# Patient Record
Sex: Male | Born: 1995 | Race: White | Hispanic: No | Marital: Single | Smoking: Never smoker
Health system: Southern US, Community
[De-identification: ages and names within clinical notes are randomized; demographics above are authoritative.]

## PROBLEM LIST (undated history)

## (undated) DIAGNOSIS — T07XXXA Unspecified multiple injuries, initial encounter: Secondary | ICD-10-CM

## (undated) DIAGNOSIS — S62009A Unspecified fracture of navicular [scaphoid] bone of unspecified wrist, initial encounter for closed fracture: Secondary | ICD-10-CM

## (undated) HISTORY — PX: ORCHIOPEXY: SHX479

---

## 2017-03-28 ENCOUNTER — Ambulatory Visit (HOSPITAL_COMMUNITY)
Admission: EM | Admit: 2017-03-28 | Discharge: 2017-03-28 | Disposition: A | Discharge status: Home / Self Care | Payer: Federal, State, Local not specified - PPO | Attending: Internal Medicine | Admitting: Internal Medicine

## 2017-03-28 ENCOUNTER — Encounter (HOSPITAL_COMMUNITY): Payer: Self-pay | Admitting: Emergency Medicine

## 2017-03-28 ENCOUNTER — Ambulatory Visit (INDEPENDENT_AMBULATORY_CARE_PROVIDER_SITE_OTHER): Payer: Federal, State, Local not specified - PPO

## 2017-03-28 DIAGNOSIS — S62009A Unspecified fracture of navicular [scaphoid] bone of unspecified wrist, initial encounter for closed fracture: Secondary | ICD-10-CM

## 2017-03-28 DIAGNOSIS — S62001A Unspecified fracture of navicular [scaphoid] bone of right wrist, initial encounter for closed fracture: Secondary | ICD-10-CM | POA: Diagnosis not present

## 2017-03-28 DIAGNOSIS — T07XXXA Unspecified multiple injuries, initial encounter: Secondary | ICD-10-CM

## 2017-03-28 DIAGNOSIS — M25531 Pain in right wrist: Secondary | ICD-10-CM

## 2017-03-28 HISTORY — DX: Unspecified multiple injuries, initial encounter: T07.XXXA

## 2017-03-28 HISTORY — DX: Unspecified fracture of navicular (scaphoid) bone of unspecified wrist, initial encounter for closed fracture: S62.009A

## 2017-03-28 NOTE — ED Notes (Signed)
Paged ortho 

## 2017-03-28 NOTE — ED Provider Notes (Signed)
[facility]    CSN: [id number] Arrival date & time: 03/28/17  1222     History   Chief Complaint Chief Complaint  Patient presents with  . Fall  . Hand Pain    HPI Clinton Odonnell is a 21 y.o. male.   Larey SeatFell off his skateboard today and landed on his right hand. He complaints of right wrist pain. He does have full ROM. Patient requesting for xray. Pain is 3/10 currently. No weakness, numbness or tingling sensation.   HPI  History reviewed. No pertinent past medical history.  There are no active problems to display for this patient.   History reviewed. No pertinent surgical history.     Home Medications    Prior to Admission medications   Not on File    Family History History reviewed. No pertinent family history.  Social History Social History   Tobacco Use  . Smoking status: Never Smoker  . Smokeless tobacco: Never Used  Substance Use Topics  . Alcohol use: Yes    Frequency: Never  . Drug use: No     Allergies   Patient has no known allergies.   Review of Systems Review of Systems  Constitutional:       See history of present illness     Physical Exam Triage Vital Signs ED Triage Vitals  Enc Vitals Group     BP 03/28/17 1308 119/60     Pulse Rate 03/28/17 1308 68     Resp 03/28/17 1308 20     Temp 03/28/17 1308 98.3 F (36.8 C)     Temp Source 03/28/17 1308 Oral     SpO2 03/28/17 1308 99 %     Weight --      Height --      Head Circumference --      Peak Flow --      Pain Score 03/28/17 1309 4     Pain Loc --      Pain Edu? --      Excl. in GC? --    No data found.  Updated Vital Signs BP 119/60 (BP Location: Left Arm)   Pulse 68   Temp 98.3 F (36.8 C) (Oral)   Resp 20   SpO2 99%   Physical Exam  Constitutional: He appears well-developed and well-nourished.  Cardiovascular: Normal rate and regular rhythm.  Pulmonary/Chest: Effort normal and breath sounds normal.  Musculoskeletal:  Right wrist has full  range of motion. Motor and sensation intact. No swelling noted. Strength intact.  Skin:  Abrasions noted on right wrist.   Nursing note and vitals reviewed.    UC Treatments / Results  Labs (all labs ordered are listed, but only abnormal results are displayed) Labs Reviewed - No data to display  EKG  EKG Interpretation None       Radiology Dg Wrist Complete Right  Result Date: 03/28/2017 CLINICAL DATA:  Per pt: fell this morning skate boarding. Visible abrasion to the palm of hand, pain was pointed to the right wrist, distal forearm, no numbness or tingling in the digits. No prior injury to the right wrist. Patient is not a diabetic EXAM: RIGHT WRIST - COMPLETE 3+ VIEW COMPARISON:  None. FINDINGS: There is a minimally displaced fracture of the scaphoid waist, not associated with subluxation or dislocation. The distal radius and ulna are intact. Intercarpal spaces are normal. IMPRESSION: Minimally displaced scaphoid fracture. Electronically Signed   By: Norva PavlovElizabeth  Brown M.D.   On: 03/28/2017 13:42  Procedures Procedures (including critical care time)  Medications Ordered in UC Medications - No data to display   Initial Impression / Assessment and Plan / UC Course  I have reviewed the triage vital signs and the nursing notes.  Pertinent labs & imaging results that were available during my care of the patient were reviewed by me and considered in my medical decision making (see chart for details).  Final Clinical Impressions(s) / UC Diagnoses   Final diagnoses:  Closed displaced fracture of scaphoid of right wrist, unspecified portion of scaphoid, initial encounter   Xray shows minimally displaced scaphoid fracture. Patient informed of the x-ray result. Short arm splint applied by the [employer]. Patient to follow up with orthopedic next week for re-evaluation. Take ibuprofen for pain relief. Splint care discussed.   ED Discharge Orders    None     Controlled Substance  Prescriptions [place] Controlled Substance Registry consulted? Not Applicable   Lucia EstelleZheng, Shelina Luo, NP 03/28/17 1401

## 2017-03-28 NOTE — Progress Notes (Signed)
Orthopedic Tech Progress Note Patient Details:  Clinton HeirDennis Custer 07-04-1995 [id number]  Ortho Devices Type of Ortho Device: Ace wrap, Arm sling, Short arm splint Ortho Device/Splint Interventions: Application   Saul FordyceJennifer C Jamil Castillo 03/28/2017, 2:02 PM

## 2017-03-28 NOTE — ED Triage Notes (Signed)
Pt reports he fell of his long board/skateboard today around 1100  Was not wearing a helmet.... Denies head inj/LOC  C/o mult abrasion to right hand, right knee and left arm  Sts right hand/wrist is painful to move  A&O x4... NAD... Ambulatory

## 2017-03-28 NOTE — ED Notes (Signed)
Ortho called... Reports given.

## 2017-04-01 ENCOUNTER — Other Ambulatory Visit (INDEPENDENT_AMBULATORY_CARE_PROVIDER_SITE_OTHER): Payer: Self-pay | Admitting: Orthopaedic Surgery

## 2017-04-01 ENCOUNTER — Encounter (INDEPENDENT_AMBULATORY_CARE_PROVIDER_SITE_OTHER): Payer: Self-pay | Admitting: Orthopaedic Surgery

## 2017-04-01 ENCOUNTER — Other Ambulatory Visit: Payer: Self-pay

## 2017-04-01 ENCOUNTER — Ambulatory Visit (INDEPENDENT_AMBULATORY_CARE_PROVIDER_SITE_OTHER): Payer: Federal, State, Local not specified - PPO | Admitting: Orthopaedic Surgery

## 2017-04-01 ENCOUNTER — Encounter (HOSPITAL_BASED_OUTPATIENT_CLINIC_OR_DEPARTMENT_OTHER): Payer: Self-pay | Admitting: *Deleted

## 2017-04-01 DIAGNOSIS — S62001A Unspecified fracture of navicular [scaphoid] bone of right wrist, initial encounter for closed fracture: Secondary | ICD-10-CM

## 2017-04-01 DIAGNOSIS — S62034A Nondisplaced fracture of proximal third of navicular [scaphoid] bone of right wrist, initial encounter for closed fracture: Secondary | ICD-10-CM

## 2017-04-01 DIAGNOSIS — S62001D Unspecified fracture of navicular [scaphoid] bone of right wrist, subsequent encounter for fracture with routine healing: Secondary | ICD-10-CM | POA: Insufficient documentation

## 2017-04-01 NOTE — Progress Notes (Signed)
   Office Visit Note   Patient: Clinton Odonnell           Date of Birth: May 25, 1995           MRN: [id number] Visit Date: 04/01/2017              Requested by: No referring provider defined for this encounter. PCP: Patient, No Pcp Per   Assessment & Plan: Visit Diagnoses:  1. Nondisplaced fracture of proximal third of navicular (scaphoid) bone of right wrist, initial encounter for closed fracture     Plan: Impression is right scaphoid mid waist and proximal fracture.  Recommendation is for compression screw fixation given risk of nonunion.  We discussed risk benefits alternatives of surgery including infection, stiffness, weakness.  He understands and wished to proceed.  We will get him scheduled for tomorrow.  Follow-Up Instructions: Return for 1 week postop visit.   Orders:  No orders of the defined types were placed in this encounter.  No orders of the defined types were placed in this encounter.     Procedures: No procedures performed   Clinical Data: No additional findings.   Subjective: Chief Complaint  Patient presents with  . 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  He sustained a mid waist proximal third right scaphoid fracture.  He denies any numbness and tingling or neurovascular compromise    Review of Systems  Constitutional: Negative.   All other systems reviewed and are negative.    Objective: Vital Signs: There were no vitals taken for this visit.  Physical Exam  Constitutional: He is oriented to person, place, and time. He appears well-developed and well-nourished.  HENT:  Head: Normocephalic and atraumatic.  Eyes: Pupils are equal, round, and reactive to light.  Neck: Neck supple.  Pulmonary/Chest: Effort normal.  Abdominal: Soft.  Musculoskeletal: Normal range of motion.  Neurological: He is alert  and oriented to person, place, and time.  Skin: Skin is warm.  Psychiatric: He has a normal mood and affect. His behavior is normal. Judgment and thought content normal.  Nursing note and vitals reviewed.   Ortho Exam Right wrist exam shows tenderness of the anatomic snuffbox and scaphoid tubercle.  No no neurovascular compromise.  Hand is warm well perfused. Specialty Comments:  No specialty comments available.  Imaging: No results found.   PMFS History: Patient Active Problem List   Diagnosis Date Noted  . Nondisplaced fracture of proximal third of navicular (scaphoid) bone of right wrist, initial encounter for closed fracture 04/01/2017   No past medical history on file.  No family history on file.  No past surgical history on file. Social History   Occupational History  . Not on file  Tobacco Use  . Smoking status: Never Smoker  . Smokeless tobacco: Never Used  Substance and Sexual Activity  . Alcohol use: Yes    Frequency: Never  . Drug use: No  . Sexual activity: Yes    Birth control/protection: None

## 2017-04-02 ENCOUNTER — Ambulatory Visit (HOSPITAL_BASED_OUTPATIENT_CLINIC_OR_DEPARTMENT_OTHER)
Admission: RE | Admit: 2017-04-02 | Discharge: 2017-04-02 | Disposition: A | Discharge status: Home / Self Care | Payer: Federal, State, Local not specified - PPO | Source: Ambulatory Visit | Attending: Orthopaedic Surgery | Admitting: Orthopaedic Surgery

## 2017-04-02 ENCOUNTER — Ambulatory Visit (HOSPITAL_BASED_OUTPATIENT_CLINIC_OR_DEPARTMENT_OTHER): Payer: Federal, State, Local not specified - PPO | Admitting: Anesthesiology

## 2017-04-02 ENCOUNTER — Encounter (HOSPITAL_BASED_OUTPATIENT_CLINIC_OR_DEPARTMENT_OTHER): Payer: Self-pay | Admitting: Certified Registered Nurse Anesthetist

## 2017-04-02 ENCOUNTER — Encounter (HOSPITAL_BASED_OUTPATIENT_CLINIC_OR_DEPARTMENT_OTHER)
Admission: RE | Disposition: A | Discharge status: Home / Self Care | Payer: Self-pay | Source: Ambulatory Visit | Attending: Orthopaedic Surgery

## 2017-04-02 ENCOUNTER — Ambulatory Visit (HOSPITAL_COMMUNITY): Payer: Federal, State, Local not specified - PPO

## 2017-04-02 DIAGNOSIS — Y9351 Activity, roller skating (inline) and skateboarding: Secondary | ICD-10-CM | POA: Insufficient documentation

## 2017-04-02 DIAGNOSIS — W19XXXA Unspecified fall, initial encounter: Secondary | ICD-10-CM | POA: Diagnosis not present

## 2017-04-02 DIAGNOSIS — S62001A Unspecified fracture of navicular [scaphoid] bone of right wrist, initial encounter for closed fracture: Secondary | ICD-10-CM | POA: Insufficient documentation

## 2017-04-02 DIAGNOSIS — S62034A Nondisplaced fracture of proximal third of navicular [scaphoid] bone of right wrist, initial encounter for closed fracture: Secondary | ICD-10-CM | POA: Diagnosis not present

## 2017-04-02 DIAGNOSIS — Z4789 Encounter for other orthopedic aftercare: Secondary | ICD-10-CM

## 2017-04-02 HISTORY — DX: Unspecified multiple injuries, initial encounter: T07.XXXA

## 2017-04-02 HISTORY — DX: Unspecified fracture of navicular (scaphoid) bone of unspecified wrist, initial encounter for closed fracture: S62.009A

## 2017-04-02 HISTORY — PX: ORIF SCAPHOID FRACTURE: SHX2130

## 2017-04-02 SURGERY — OPEN REDUCTION INTERNAL FIXATION (ORIF) SCAPHOID FRACTURE
Anesthesia: General | Site: Arm Lower | Laterality: Right

## 2017-04-02 MED ORDER — ONDANSETRON HCL 4 MG/2ML IJ SOLN
INTRAMUSCULAR | Status: AC
  Filled 2017-04-02: qty 2

## 2017-04-02 MED ORDER — FENTANYL CITRATE (PF) 100 MCG/2ML IJ SOLN
INTRAMUSCULAR | Status: DC | PRN
  Administered 2017-04-02 (×4): 25 ug via INTRAVENOUS

## 2017-04-02 MED ORDER — OXYCODONE-ACETAMINOPHEN 5-325 MG PO TABS: 1.0000 | ORAL_TABLET | ORAL | 0 refills | Status: AC | PRN

## 2017-04-02 MED ORDER — BUPIVACAINE-EPINEPHRINE (PF) 0.5% -1:200000 IJ SOLN
INTRAMUSCULAR | Status: DC | PRN
  Administered 2017-04-02: 25 mL via PERINEURAL

## 2017-04-02 MED ORDER — PROPOFOL 10 MG/ML IV BOLUS
INTRAVENOUS | Status: DC | PRN
  Administered 2017-04-02: 200 mg via INTRAVENOUS

## 2017-04-02 MED ORDER — PROMETHAZINE HCL 25 MG PO TABS: 25.0000 mg | ORAL_TABLET | Freq: Four times a day (QID) | ORAL | 1 refills | Status: AC | PRN

## 2017-04-02 MED ORDER — DEXAMETHASONE SODIUM PHOSPHATE 10 MG/ML IJ SOLN
INTRAMUSCULAR | Status: AC
  Filled 2017-04-02: qty 1

## 2017-04-02 MED ORDER — FENTANYL CITRATE (PF) 100 MCG/2ML IJ SOLN
INTRAMUSCULAR | Status: AC
  Filled 2017-04-02: qty 2

## 2017-04-02 MED ORDER — DEXAMETHASONE SODIUM PHOSPHATE 10 MG/ML IJ SOLN
INTRAMUSCULAR | Status: DC | PRN
  Administered 2017-04-02: 10 mg via INTRAVENOUS

## 2017-04-02 MED ORDER — LACTATED RINGERS IV SOLN
INTRAVENOUS | Status: DC
  Administered 2017-04-02 (×2): via INTRAVENOUS

## 2017-04-02 MED ORDER — LIDOCAINE 2% (20 MG/ML) 5 ML SYRINGE
INTRAMUSCULAR | Status: AC
  Filled 2017-04-02: qty 5

## 2017-04-02 MED ORDER — FENTANYL CITRATE (PF) 100 MCG/2ML IJ SOLN
50.0000 ug | INTRAMUSCULAR | Status: DC | PRN
  Administered 2017-04-02 (×2): 50 ug via INTRAVENOUS

## 2017-04-02 MED ORDER — LIDOCAINE 2% (20 MG/ML) 5 ML SYRINGE
INTRAMUSCULAR | Status: DC | PRN
  Administered 2017-04-02: 100 mg via INTRAVENOUS

## 2017-04-02 MED ORDER — CEFAZOLIN SODIUM-DEXTROSE 2-4 GM/100ML-% IV SOLN
INTRAVENOUS | Status: AC
  Filled 2017-04-02: qty 100

## 2017-04-02 MED ORDER — HYDROMORPHONE HCL 1 MG/ML IJ SOLN: 0.2500 mg | INTRAMUSCULAR | Status: DC | PRN

## 2017-04-02 MED ORDER — CEFAZOLIN SODIUM-DEXTROSE 2-4 GM/100ML-% IV SOLN
2.0000 g | INTRAVENOUS | Status: AC
Start: 2017-04-03 — End: 2017-04-02
  Administered 2017-04-02: 2 g via INTRAVENOUS

## 2017-04-02 MED ORDER — PROMETHAZINE HCL 25 MG/ML IJ SOLN: 6.2500 mg | INTRAMUSCULAR | Status: DC | PRN

## 2017-04-02 MED ORDER — MIDAZOLAM HCL 2 MG/2ML IJ SOLN
1.0000 mg | INTRAMUSCULAR | Status: DC | PRN
  Administered 2017-04-02 (×2): 1 mg via INTRAVENOUS

## 2017-04-02 MED ORDER — SCOPOLAMINE 1 MG/3DAYS TD PT72: 1.0000 | MEDICATED_PATCH | Freq: Once | TRANSDERMAL | Status: DC | PRN

## 2017-04-02 MED ORDER — ONDANSETRON HCL 4 MG/2ML IJ SOLN
INTRAMUSCULAR | Status: DC | PRN
  Administered 2017-04-02: 4 mg via INTRAVENOUS

## 2017-04-02 MED ORDER — MIDAZOLAM HCL 2 MG/2ML IJ SOLN
INTRAMUSCULAR | Status: AC
  Filled 2017-04-02: qty 2

## 2017-04-02 SURGICAL SUPPLY — 69 items
BANDAGE ACE 3X5.8 VEL STRL LF (GAUZE/BANDAGES/DRESSINGS) ×3 IMPLANT
BANDAGE ACE 4X5 VEL STRL LF (GAUZE/BANDAGES/DRESSINGS) IMPLANT
BIT DRILL CANN 2.4 (BIT) ×2
BIT DRILL CANN 2.4MM (BIT) ×1
BIT DRILL CANN MAX VPC 2.4 (BIT) ×1 IMPLANT
BLADE MINI RND TIP GREEN BEAV (BLADE) IMPLANT
BLADE SURG 15 STRL LF DISP TIS (BLADE) ×2 IMPLANT
BLADE SURG 15 STRL SS (BLADE) ×4
BNDG ESMARK 4X9 LF (GAUZE/BANDAGES/DRESSINGS) ×3 IMPLANT
BRUSH SCRUB EZ PLAIN DRY (MISCELLANEOUS) ×3 IMPLANT
CORD BIPOLAR FORCEPS 12FT (ELECTRODE) ×3 IMPLANT
COVER BACK TABLE 60X90IN (DRAPES) ×3 IMPLANT
COVER MAYO STAND STRL (DRAPES) ×3 IMPLANT
CUFF TOURNIQUET SINGLE 18IN (TOURNIQUET CUFF) ×3 IMPLANT
DECANTER SPIKE VIAL GLASS SM (MISCELLANEOUS) IMPLANT
DRAPE C-ARM 42X72 X-RAY (DRAPES) ×3 IMPLANT
DRAPE EXTREMITY T 121X128X90 (DRAPE) ×3 IMPLANT
DRAPE SURG 17X23 STRL (DRAPES) ×3 IMPLANT
GAUZE SPONGE 4X4 12PLY STRL (GAUZE/BANDAGES/DRESSINGS) ×3 IMPLANT
GAUZE SPONGE 4X4 16PLY XRAY LF (GAUZE/BANDAGES/DRESSINGS) IMPLANT
GAUZE XEROFORM 1X8 LF (GAUZE/BANDAGES/DRESSINGS) ×3 IMPLANT
GLOVE BIO SURGEON STRL SZ 6.5 (GLOVE) ×2 IMPLANT
GLOVE BIO SURGEONS STRL SZ 6.5 (GLOVE) ×1
GLOVE BIOGEL PI IND STRL 6.5 (GLOVE) ×1 IMPLANT
GLOVE BIOGEL PI IND STRL 7.0 (GLOVE) ×2 IMPLANT
GLOVE BIOGEL PI INDICATOR 6.5 (GLOVE) ×2
GLOVE BIOGEL PI INDICATOR 7.0 (GLOVE) ×4
GLOVE SKINSENSE NS SZ7.5 (GLOVE) ×2
GLOVE SKINSENSE STRL SZ7.5 (GLOVE) ×1 IMPLANT
GLOVE SURG SYN 7.5  E (GLOVE) ×2
GLOVE SURG SYN 7.5 E (GLOVE) ×1 IMPLANT
GOWN STRL REIN XL XLG (GOWN DISPOSABLE) ×3 IMPLANT
GOWN STRL REUS W/ TWL LRG LVL3 (GOWN DISPOSABLE) ×1 IMPLANT
GOWN STRL REUS W/TWL LRG LVL3 (GOWN DISPOSABLE) ×2
K-WIRE COCR 1.1X105 (WIRE) ×6
KWIRE COCR 1.1X105 (WIRE) ×2 IMPLANT
NEEDLE HYPO 22GX1.5 SAFETY (NEEDLE) IMPLANT
NS IRRIG 1000ML POUR BTL (IV SOLUTION) ×3 IMPLANT
PACK BASIN DAY SURGERY FS (CUSTOM PROCEDURE TRAY) ×3 IMPLANT
PAD CAST 3X4 CTTN HI CHSV (CAST SUPPLIES) ×1 IMPLANT
PADDING CAST ABS 3INX4YD NS (CAST SUPPLIES)
PADDING CAST ABS COTTON 3X4 (CAST SUPPLIES) IMPLANT
PADDING CAST COTTON 3X4 STRL (CAST SUPPLIES) ×2
PADDING CAST SYNTHETIC 3 NS LF (CAST SUPPLIES)
PADDING CAST SYNTHETIC 3X4 NS (CAST SUPPLIES) IMPLANT
PADDING CAST SYNTHETIC 4 (CAST SUPPLIES)
PADDING CAST SYNTHETIC 4X4 STR (CAST SUPPLIES) IMPLANT
SCREW CANN MAX VPC 3.4X18 (Screw) ×1 IMPLANT
SCREW CANN MAX VPC 3.4X22 (Screw) ×1 IMPLANT
SCREW MAX VPS 3.4X22 (Screw) ×2 IMPLANT
SCREW VPS 3.4X18MM (Screw) ×2 IMPLANT
SLEEVE SCD COMPRESS KNEE MED (MISCELLANEOUS) ×3 IMPLANT
SPLINT FIBERGLASS 3X35 (CAST SUPPLIES) IMPLANT
SPONGE LAP 18X18 X RAY DECT (DISPOSABLE) ×3 IMPLANT
STOCKINETTE 4X48 STRL (DRAPES) ×3 IMPLANT
SUCTION FRAZIER HANDLE 10FR (MISCELLANEOUS) ×2
SUCTION TUBE FRAZIER 10FR DISP (MISCELLANEOUS) ×1 IMPLANT
SUT ETHILON 3 0 PS 1 (SUTURE) ×3 IMPLANT
SUT VIC AB 2-0 SH 27 (SUTURE) ×2
SUT VIC AB 2-0 SH 27XBRD (SUTURE) ×1 IMPLANT
SYR BULB 3OZ (MISCELLANEOUS) ×3 IMPLANT
SYR CONTROL 10ML LL (SYRINGE) IMPLANT
TOWEL OR 17X24 6PK STRL BLUE (TOWEL DISPOSABLE) ×6 IMPLANT
TOWEL OR NON WOVEN STRL DISP B (DISPOSABLE) ×3 IMPLANT
TRAY DSU PREP LF (CUSTOM PROCEDURE TRAY) ×3 IMPLANT
TUBE CONNECTING 20'X1/4 (TUBING) ×1
TUBE CONNECTING 20X1/4 (TUBING) ×2 IMPLANT
UNDERPAD 30X30 (UNDERPADS AND DIAPERS) ×3 IMPLANT
YANKAUER SUCT BULB TIP NO VENT (SUCTIONS) IMPLANT

## 2017-04-02 NOTE — Anesthesia Procedure Notes (Signed)
Procedure Name: LMA Insertion Date/Time: 04/02/2017 2:35 PM Performed by: Pearson Grippeobertson, Camika Marsico M, CRNA Pre-anesthesia Checklist: Patient identified, Emergency Drugs available, Suction available and Patient being monitored Patient Re-evaluated:Patient Re-evaluated prior to induction Oxygen Delivery Method: Circle system utilized Preoxygenation: Pre-oxygenation with 100% oxygen Induction Type: IV induction Ventilation: Mask ventilation without difficulty LMA: LMA inserted LMA Size: 4.0 Number of attempts: 1 Airway Equipment and Method: Bite block Placement Confirmation: positive ETCO2 Tube secured with: Tape Dental Injury: Teeth and Oropharynx as per pre-operative assessment

## 2017-04-02 NOTE — Anesthesia Procedure Notes (Addendum)
Anesthesia Regional Block: Supraclavicular block   Pre-Anesthetic Checklist: ,, timeout performed, Correct Patient, Correct Site, Correct Laterality, Correct Procedure, Correct Position, site marked, Risks and benefits discussed,  Surgical consent,  Pre-op evaluation,  At surgeon's request and post-op pain management  Laterality: Right and Upper  Prep: chloraprep       Needles:   Needle Type: Echogenic Stimulator Needle     Needle Length: 9cm  Needle Gauge: 21   Needle insertion depth: 5 cm   Additional Needles:   Procedures:,,,, ultrasound used (permanent image in chart),,,,  Narrative:  Start time: 04/02/2017 2:10 PM End time: 04/02/2017 2:26 PM Injection made incrementally with aspirations every 5 mL.  Performed by: Personally  Anesthesiologist: Sharee HolsterMassagee, Kellen Dutch, MD

## 2017-04-02 NOTE — H&P (Signed)
    PREOPERATIVE H&P  Chief Complaint: right scaphoid fracture  HPI: Clinton HeirDennis Odonnell is a 21 y.o. male who presents for surgical treatment of right scaphoid fracture.  He denies any changes in medical history.  Past Medical History:  Diagnosis Date  . Abrasions of multiple sites 03/28/2017   bilateral forearm, right hand  . Scaphoid fracture of wrist 03/28/2017   right   Past Surgical History:  Procedure Laterality Date  . ORCHIOPEXY     age 21   Social History   Socioeconomic History  . Marital status: Single    Spouse name: None  . Number of children: None  . Years of education: None  . Highest education level: None  Social Needs  . Financial resource strain: None  . Food insecurity - worry: None  . Food insecurity - inability: None  . Transportation needs - medical: None  . Transportation needs - non-medical: None  Occupational History  . None  Tobacco Use  . Smoking status: Never Smoker  . Smokeless tobacco: Never Used  Substance and Sexual Activity  . Alcohol use: Yes    Frequency: Never    Comment: occasionally  . Drug use: No  . Sexual activity: Yes    Birth control/protection: None  Other Topics Concern  . None  Social History Narrative  . None   History reviewed. No pertinent family history. No Known Allergies Prior to Admission medications   Not on File     Positive ROS: All other systems have been reviewed and were otherwise negative with the exception of those mentioned in the HPI and as above.  Physical Exam: General: Alert, no acute distress Cardiovascular: No pedal edema Respiratory: No cyanosis, no use of accessory musculature GI: abdomen soft Skin: No lesions in the area of chief complaint Neurologic: Sensation intact distally Psychiatric: Patient is competent for consent with normal mood and affect Lymphatic: no lymphedema  MUSCULOSKELETAL: exam stable  Assessment: right scaphoid fracture  Plan: Plan for  Procedure(s): OPEN REDUCTION INTERNAL FIXATION (ORIF) RIGHT SCAPHOID FRACTURE  The risks benefits and alternatives were discussed with the patient including but not limited to the risks of nonoperative treatment, versus surgical intervention including infection, bleeding, nerve injury,  blood clots, cardiopulmonary complications, morbidity, mortality, among others, and they were willing to proceed.   Glee ArvinMichael Rachel Rison, MD   04/02/2017 2:09 PM

## 2017-04-02 NOTE — Anesthesia Preprocedure Evaluation (Signed)
Anesthesia Evaluation  Patient identified by MRN, date of birth, ID band Patient awake    Reviewed: Allergy & Precautions, NPO status , Patient's Chart, lab work & pertinent test results  Airway Mallampati: I  TM Distance: >3 FB Neck ROM: Full    Dental no notable dental hx. (+) Teeth Intact   Pulmonary neg pulmonary ROS,    breath sounds clear to auscultation       Cardiovascular negative cardio ROS   Rhythm:Regular Rate:Normal     Neuro/Psych negative neurological ROS  negative psych ROS   GI/Hepatic negative GI ROS, Neg liver ROS,   Endo/Other  negative endocrine ROS  Renal/GU negative Renal ROS  negative genitourinary   Musculoskeletal negative musculoskeletal ROS (+)   Abdominal   Peds negative pediatric ROS (+)  Hematology negative hematology ROS (+)   Anesthesia Other Findings   Reproductive/Obstetrics negative OB ROS                             Anesthesia Physical Anesthesia Plan  ASA: I  Anesthesia Plan: General   Post-op Pain Management:  Regional for Post-op pain   Induction: Intravenous  PONV Risk Score and Plan: 3 and Ondansetron, Dexamethasone and Treatment may vary due to age or medical condition  Airway Management Planned: LMA  Additional Equipment:   Intra-op Plan:   Post-operative Plan: Extubation in OR  Informed Consent: I have reviewed the patients History and Physical, chart, labs and discussed the procedure including the risks, benefits and alternatives for the proposed anesthesia with the patient or authorized representative who has indicated his/her understanding and acceptance.   Dental advisory given  Plan Discussed with: CRNA  Anesthesia Plan Comments:         Anesthesia Quick Evaluation

## 2017-04-02 NOTE — Op Note (Addendum)
   DATE OF SURGERY:04/02/2017  PREOPERATIVE DIAGNOSIS:    POSTOPERATIVE DIAGNOSIS: same  PROCEDURE: 1. Right open treatment internal fixation of scaphoid fracture. CPT 803 [id number]  IMPLANT: Zimmer Max VPC 18 mm x 3.4 mm screw  SURGEON: N. Glee ArvinMichael Xu, MD  ANESTHESIA:  General  TOURNIQUET TIME: 41 mins.  BLOOD LOSS: Minimal.  COMPLICATIONS: None.  PATHOLOGY: None.  TIME OUT: A time out was performed before the procedure started.  INDICATIONS: The patient is a 21 y.o. -year-old male who presented with a right scaphoid fracture indicated for osteosynthesis.  DESCRIPTION OF PROCEDURE: The operative extremity was prepped and draped in standard sterile fashion.  Exsanguination was achieved.  Tourniquet was inflated to 250 mmHg.  A 2 cm incision distal to Lister's tubercle was created.  Blunt dissection was carried down to the extensor retinaculum and the EPL tendon.  No branches of sensory nerves were encountered.  The EPL tendon was retracted ulnarly.  The wrist was then flexed and pronated in order to deliver the proximal pole of the scaphoid.  A guidepin was then placed at the proximal pole of the scaphoid using fluoroscopic guidance.  The guide pin was then advanced in a distal radial direction using fluoroscopic guidance.  Multiple fluoroscopic views were taken to confirm appropriate trajectory of the guidepin.  The length of the screw was then measured.  The guidepin was then advanced past the far cortex.  The near cortex was then drilled using the cannulated drill bit.  The 18 mm x 3.4 mm cannulated compression screw was then inserted over the K wire under fluoroscopic guidance to the appropriate depth.  Multiple fluoroscopic views were taken confirming the screw inside the scaphoid without violating the articular surface.  At this point, all wires were removed.  Final fluoroscopy imaging was taken and wound was irrigated and closed using 3-0 nylon sutures.  Sterile dressing applied.   Tourniquet was deflated.  A thumb spica  brace was applied.  All counts were correct.  The patient was transferred to the recovery room under stable condition.  Mayra ReelN. Michael Xu, MD [facility] [phone] 3:42 PM

## 2017-04-02 NOTE — Progress Notes (Signed)
Assisted Dr. Massagee with right, ultrasound guided, supraclavicular block. Side rails up, monitors on throughout procedure. See vital signs in flow sheet. Tolerated Procedure well. 

## 2017-04-02 NOTE — Anesthesia Postprocedure Evaluation (Signed)
Anesthesia Post Note  Patient: Clinton Odonnell  Procedure(s) Performed: OPEN REDUCTION INTERNAL FIXATION (ORIF) RIGHT SCAPHOID FRACTURE (Right Arm Lower)     Patient location during evaluation: PACU Anesthesia Type: General Level of consciousness: awake and alert Pain management: pain level controlled Vital Signs Assessment: post-procedure vital signs reviewed and stable Respiratory status: spontaneous breathing, nonlabored ventilation, respiratory function stable and patient connected to nasal cannula oxygen Cardiovascular status: blood pressure returned to baseline and stable Postop Assessment: no apparent nausea or vomiting Anesthetic complications: no    Last Vitals:  Vitals:   04/02/17 1615 04/02/17 1630  BP: 120/79 116/72  Pulse: 78 70  Resp: 14 10  Temp:    SpO2: 97% 97%    Last Pain:  Vitals:   04/02/17 1615  TempSrc:   PainSc: Ardean LarsenAsleep                 [name] E

## 2017-04-02 NOTE — Discharge Instructions (Signed)
Bike Safety, Adult Riding a bike is a fun activity that is good for your health. However, it is important that you know how to stay safe while biking. What do I need to wear while biking? Helmet A helmet is the most important piece of equipment that you can wear to protect yourself while riding a bike. Make sure that you:  Always wear a helmet when you ride a bike, and make sure that the straps are fastened.  Wear a helmet that is specifically made for biking.  Have a helmet that has been safety-approved. Look for a helmet that has a Midwife) sticker. If you have any questions, ask them at the store where you are buying the helmet. Never buy a used helmet.  Get a new helmet if you get into a bike accident. You should also get a new helmet every five years or sooner.  Have a helmet that is well-ventilated.  A helmet will not help to protect you if it does not fit properly. Here are some tips to make sure your helmet fits:  The helmet should sit on top of your head. It should not tip backward or forward.  Find the smallest helmet shell size that fits over your head.  Do not use helmet pads to make a helmet fit if it is too big for your head.  Leave space for about two fingers between your eyebrows and the front brim of the helmet.  The straps should be joined under each of your ears at the jawbone.  The buckle should be snug when your mouth is completely open.  Other equipment Make sure that you wear:  Shoes that are safe for biking, such as sneakers. The shoes should not slip on the pedals. Do not ride a bike barefoot. ? Do not wear flip flops. ? Do not wear cleats. ? Do not wear shoes with heels.  Pants that are fitted, if you are wearing pants. If your pants are too loose or wide at the bottom, they can get stuck in the bike chain.  Bright or fluorescent clothes. This helps you to be visible. Avoid dark-colored clothes. ? Reflective tape is  also helpful.  Clothes that are comfortable and appropriate for the weather.  What rules do I need to know to bike safely? You need to know to:  Obey all traffic signs. These include: ? Stop signs. ? Traffic lights.  Bike in the same direction as the cars. Never bike against traffic.  Use hand signals, including signals to: ? Make a left-hand turn. ? Make a right-hand turn. ? Stop.  Never listen to headphones while riding a bike.  Never text or talk on a cell phone while riding a bike.  Never stand up while riding a bike.  Always stop and check for pedestrians, cars, and any other traffic whenever you start a bike ride. Always look in both directions.  Never have more than one adult on a bike. If you are carrying a child in a bike seat, make sure that the bike seat or carrier has been safety-approved.  Be careful. Watch for: ? Cars opening up doors. ? Cars leaving driveways. ? Pedestrians. ? Road hazards, such as potholes or puddles.  Ride in single file if you are riding in a group.  Walk your bike across busy intersections.  Pass on the left side, if you are passing a pedestrian or another biker. Call out that you are on the left  so the pedestrian or biker knows that you are there.  Never attach your bike to another moving object, vehicle, or pet.  Always hold the handlebars with both hands.  Always use bike lanes or paths when they are available.  What should I check before riding a bike? You should always check that:  Your helmet fits properly. This is important because straps can loosen over time.  The bike's front and back brakes work.  The bike's tires are inflated properly.  The seat is at the correct level.  The chain is not loose, rusted, or making cracking or grinding noises when in use.  When should I avoid riding a bike? Do not ride a bike:  If the weather conditions are unsafe, such as during a thunderstorm or if the roads are icy.  If it  is dark outside. If you must ride at night, make sure that you wear bright clothing and have reflectors or lights in the front and back of the bike.  If you have been drinking alcohol or using drugs.  If your health care provider has advised you not to ride a bike.  This information is not intended to replace advice given to you by your health care provider. Make sure you discuss any questions you have with your health care provider. Document Released: 07/27/2003 Document Revised: 11/24/2015 Document Reviewed: 03/30/2014 Elsevier Interactive Patient Education  2018 [employer]    Postoperative instructions:     Weightbearing instructions: non weight bearing  Keep your dressing and/or splint clean and dry at all times.  You can remove your dressing on post-operative day #3 and change with a dry/sterile dressing or Band-Aids as needed thereafter.    Incision instructions:  Do not soak your incision for 3 weeks after surgery.  If the incision gets wet, pat dry and do not scrub the incision.  Pain control:  You have been given a prescription to be taken as directed for post-operative pain control.  In addition, elevate the operative extremity above the heart at all times to prevent swelling and throbbing pain.  Take over-the-counter Colace, 100mg  by mouth twice a day while taking narcotic pain medications to help prevent constipation.  Follow up appointments: 1) 10-14 days for suture removal and wound check. 2) Dr. Roda ShuttersXu as scheduled.   -------------------------------------------------------------------------------------------------------------  After Surgery Pain Control:  After your surgery, post-surgical discomfort or pain is likely. This discomfort can last several days to a few weeks. At certain times of the day your discomfort may be more intense.  Did you receive a nerve block?  A nerve block can provide pain relief for one hour to two days after your surgery. As long as the  nerve block is working, you will experience little or no sensation in the area the surgeon operated on.  As the nerve block wears off, you will begin to experience pain or discomfort. It is very important that you begin taking your prescribed pain medication before the nerve block fully wears off. Treating your pain at the first sign of the block wearing off will ensure your pain is better controlled and more tolerable when full-sensation returns. Do not wait until the pain is intolerable, as the medicine will be less effective. It is better to treat pain in advance than to try and catch up.  General Anesthesia:  If you did not receive a nerve block during your surgery, you will need to start taking your pain medication shortly after your surgery and should  continue to do so as prescribed by your surgeon.  Pain Medication:  Most commonly we prescribe Vicodin and Percocet for post-operative pain. Both of these medications contain a combination of acetaminophen (Tylenol) and a narcotic to help control pain.   It takes between 30 and 45 minutes before pain medication starts to work. It is important to take your medication before your pain level gets too intense.   Nausea is a common side effect of many pain medications. You will want to eat something before taking your pain medicine to help prevent nausea.   If you are taking a prescription pain medication that contains acetaminophen, we recommend that you do not take additional over the counter acetaminophen (Tylenol).  Other pain relieving options:   Using a cold pack to ice the affected area a few times a day (15 to 20 minutes at a time) can help to relieve pain, reduce swelling and bruising.   Elevation of the affected area can also help to reduce pain and swelling.     Call your surgeon if you experience:   1.  Fever over 101.0. 2.  Inability to urinate. 3.  Nausea and/or vomiting. 4.  Extreme swelling or bruising at the surgical  site. 5.  Continued bleeding from the incision. 6.  Increased pain, redness or drainage from the incision. 7.  Problems related to your pain medication. 8.  Any problems and/or concerns    Post Anesthesia Home Care Instructions  Activity: Get plenty of rest for the remainder of the day. A responsible individual must stay with you for 24 hours following the procedure.  For the next 24 hours, DO NOT: -Drive a car -Advertising copywriter -Drink alcoholic beverages -Take any medication unless instructed by your physician -Make any legal decisions or sign important papers.  Meals: Start with liquid foods such as gelatin or soup. Progress to regular foods as tolerated. Avoid greasy, spicy, heavy foods. If nausea and/or vomiting occur, drink only clear liquids until the nausea and/or vomiting subsides. Call your physician if vomiting continues.  Special Instructions/Symptoms: Your throat may feel dry or sore from the anesthesia or the breathing tube placed in your throat during surgery. If this causes discomfort, gargle with warm salt water. The discomfort should disappear within 24 hours.  If you had a scopolamine patch placed behind your ear for the management of post- operative nausea and/or vomiting:  1. The medication in the patch is effective for 72 hours, after which it should be removed.  Wrap patch in a tissue and discard in the trash. Wash hands thoroughly with soap and water. 2. You may remove the patch earlier than 72 hours if you experience unpleasant side effects which may include dry mouth, dizziness or visual disturbances. 3. Avoid touching the patch. Wash your hands with soap and water after contact with the patch.     Regional Anesthesia Blocks  1. Numbness or the inability to move the "blocked" extremity may last from 3-48 hours after placement. The length of time depends on the medication injected and your individual response to the medication. If the numbness is not going  away after 48 hours, call your surgeon.  2. The extremity that is blocked will need to be protected until the numbness is gone and the  Strength has returned. Because you cannot feel it, you will need to take extra care to avoid injury. Because it may be weak, you may have difficulty moving it or using it. You may not know what  position it is in without looking at it while the block is in effect.  3. For blocks in the legs and feet, returning to weight bearing and walking needs to be done carefully. You will need to wait until the numbness is entirely gone and the strength has returned. You should be able to move your leg and foot normally before you try and bear weight or walk. You will need someone to be with you when you first try to ensure you do not fall and possibly risk injury.  4. Bruising and tenderness at the needle site are common side effects and will resolve in a few days.  5. Persistent numbness or new problems with movement should be communicated to the surgeon or the [facility]  [phone])/ [facility] [phone]).

## 2017-04-02 NOTE — Transfer of Care (Signed)
Immediate Anesthesia Transfer of Care Note  Patient: Clinton Odonnell  Procedure(s) Performed: OPEN REDUCTION INTERNAL FIXATION (ORIF) RIGHT SCAPHOID FRACTURE (Right Arm Lower)  Patient Location: PACU  Anesthesia Type:GA combined with regional for post-op pain  Level of Consciousness: drowsy and patient cooperative  Airway & Oxygen Therapy: Patient Spontanous Breathing and Patient connected to face mask oxygen  Post-op Assessment: Report given to RN and Post -op Vital signs reviewed and stable  Post vital signs: Reviewed and stable  Last Vitals:  Vitals:   04/02/17 1419 04/02/17 1420  BP:    Pulse: 78 80  Resp: 13 14  Temp:    SpO2: 100% 100%    Last Pain:  Vitals:   04/02/17 1224  TempSrc: Oral  PainSc:       Patients Stated Pain Goal: 6 (04/02/17 1224)  Complications: No apparent anesthesia complications

## 2017-04-03 ENCOUNTER — Encounter (HOSPITAL_BASED_OUTPATIENT_CLINIC_OR_DEPARTMENT_OTHER): Payer: Self-pay | Admitting: Orthopaedic Surgery

## 2017-04-08 ENCOUNTER — Ambulatory Visit (INDEPENDENT_AMBULATORY_CARE_PROVIDER_SITE_OTHER): Payer: Self-pay

## 2017-04-08 ENCOUNTER — Ambulatory Visit (INDEPENDENT_AMBULATORY_CARE_PROVIDER_SITE_OTHER): Payer: Federal, State, Local not specified - PPO | Admitting: Orthopaedic Surgery

## 2017-04-08 ENCOUNTER — Encounter (INDEPENDENT_AMBULATORY_CARE_PROVIDER_SITE_OTHER): Payer: Self-pay | Admitting: Orthopaedic Surgery

## 2017-04-08 DIAGNOSIS — S62034A Nondisplaced fracture of proximal third of navicular [scaphoid] bone of right wrist, initial encounter for closed fracture: Secondary | ICD-10-CM

## 2017-04-08 NOTE — Progress Notes (Signed)
Patient is one-week status post compression screw fixation of scaphoid fracture.  He is overall doing well and just complains of some occasional throbbing.  Denies any numbness and tingling.  Incision is clean dry and intact.  Neurovascularly intact.  X-rays show stable fixation without complications.  Patient is doing well from my standpoint.  Begin gentle range of motion on his own.  Follow-up next week for suture removal.  No x-rays needed.  Anticipate beginning hand therapy at 4 weeks.

## 2017-04-15 ENCOUNTER — Encounter (INDEPENDENT_AMBULATORY_CARE_PROVIDER_SITE_OTHER): Payer: Self-pay | Admitting: Orthopaedic Surgery

## 2017-04-15 ENCOUNTER — Ambulatory Visit (INDEPENDENT_AMBULATORY_CARE_PROVIDER_SITE_OTHER): Payer: Federal, State, Local not specified - PPO | Admitting: Orthopaedic Surgery

## 2017-04-15 DIAGNOSIS — S62034A Nondisplaced fracture of proximal third of navicular [scaphoid] bone of right wrist, initial encounter for closed fracture: Secondary | ICD-10-CM

## 2017-04-15 NOTE — Progress Notes (Signed)
Clinton Odonnell is 2 weeks status post compression screw fixation of scaphoid fracture.  He is doing well.  Not complaining of any significant pain.  Sutures removed today.  Exam is benign.  No signs of infection.  Follow-up in 2 weeks with three-view x-rays of the right wrist.  Will anticipate beginning hand therapy at that time.

## 2017-04-29 ENCOUNTER — Ambulatory Visit (INDEPENDENT_AMBULATORY_CARE_PROVIDER_SITE_OTHER): Payer: Federal, State, Local not specified - PPO | Admitting: Orthopaedic Surgery

## 2017-05-02 ENCOUNTER — Telehealth (INDEPENDENT_AMBULATORY_CARE_PROVIDER_SITE_OTHER): Payer: Self-pay | Admitting: Orthopaedic Surgery

## 2017-05-02 NOTE — Telephone Encounter (Signed)
Patient called checking on request for records. I told him I did not have his request. He stated he signed release over a week ago. I apologized to him and asked him if he could sign another form I would fax his  Records as soon as I get his form. He has appt Monday and said he would fill out another form on Monday.

## 2017-05-05 ENCOUNTER — Ambulatory Visit (INDEPENDENT_AMBULATORY_CARE_PROVIDER_SITE_OTHER): Payer: Federal, State, Local not specified - PPO | Admitting: Orthopaedic Surgery

## 2017-05-05 ENCOUNTER — Ambulatory Visit (INDEPENDENT_AMBULATORY_CARE_PROVIDER_SITE_OTHER): Payer: Self-pay

## 2017-05-05 ENCOUNTER — Encounter (INDEPENDENT_AMBULATORY_CARE_PROVIDER_SITE_OTHER): Payer: Self-pay | Admitting: Orthopaedic Surgery

## 2017-05-05 DIAGNOSIS — S62034A Nondisplaced fracture of proximal third of navicular [scaphoid] bone of right wrist, initial encounter for closed fracture: Secondary | ICD-10-CM

## 2017-05-05 NOTE — Progress Notes (Signed)
Patient ID: Clinton Odonnell, male   DOB: [birth year], 21 y.o.   MRN: [id number]  Clinton Odonnell is 33 days status post compression screw placement of a scaphoid waist fracture.  He is doing well.  Denies any pain.  He does lack about 20 degrees of wrist flexion.  He lacks 5 degrees of wrist extension.  X-rays show stable fixation with evidence of healing.  At this point he may discontinue the wrist brace.  Increase activity as tolerated.  Hand therapy referral was made today.  Follow-up in 6 weeks with three-view x-rays of the right wrist.

## 2017-05-21 ENCOUNTER — Telehealth (INDEPENDENT_AMBULATORY_CARE_PROVIDER_SITE_OTHER): Payer: Self-pay | Admitting: Orthopaedic Surgery

## 2017-05-21 NOTE — Telephone Encounter (Signed)
Patient called stating records have not been received at [employer] when faxed 12/17. Confirmation was received. I refaxed records [phone]

## 2017-05-22 ENCOUNTER — Telehealth (INDEPENDENT_AMBULATORY_CARE_PROVIDER_SITE_OTHER): Payer: Self-pay

## 2017-05-22 NOTE — Telephone Encounter (Signed)
Faxed Oxycodone RX to school. Clinton ArntAttnJacquelyne Odonnell: Clinton Odonnell [phone]

## 2017-06-17 ENCOUNTER — Ambulatory Visit (INDEPENDENT_AMBULATORY_CARE_PROVIDER_SITE_OTHER): Payer: Federal, State, Local not specified - PPO | Admitting: Orthopaedic Surgery

## 2017-06-17 ENCOUNTER — Ambulatory Visit (INDEPENDENT_AMBULATORY_CARE_PROVIDER_SITE_OTHER): Payer: Federal, State, Local not specified - PPO

## 2017-06-17 ENCOUNTER — Encounter (INDEPENDENT_AMBULATORY_CARE_PROVIDER_SITE_OTHER): Payer: Self-pay | Admitting: Orthopaedic Surgery

## 2017-06-17 DIAGNOSIS — M25531 Pain in right wrist: Secondary | ICD-10-CM

## 2017-06-17 DIAGNOSIS — S62034A Nondisplaced fracture of proximal third of navicular [scaphoid] bone of right wrist, initial encounter for closed fracture: Secondary | ICD-10-CM

## 2017-06-17 NOTE — Progress Notes (Signed)
Clinton Odonnell is 74 days status post compression screw fixation of scaphoid fracture.  He is overall doing well.  He denies any pain.  Is not taking medicines.  His surgical scar is fully healed.  He has full range of motion of his wrist.  He has excellent grip strength.  X-rays demonstrate healing of the fracture.  I am going to release him to full activity on July 03, 2017.  Questions encouraged and answered.  Follow-up as needed.

## 2017-08-22 ENCOUNTER — Telehealth (INDEPENDENT_AMBULATORY_CARE_PROVIDER_SITE_OTHER): Payer: Self-pay | Admitting: Orthopaedic Surgery

## 2017-08-22 NOTE — Telephone Encounter (Signed)
IC,LMVM copy of records ready for patient to pick up

## 2017-08-28 ENCOUNTER — Emergency Department (HOSPITAL_COMMUNITY): Payer: Federal, State, Local not specified - PPO

## 2017-08-28 ENCOUNTER — Encounter (HOSPITAL_COMMUNITY): Payer: Self-pay | Admitting: *Deleted

## 2017-08-28 ENCOUNTER — Emergency Department (HOSPITAL_COMMUNITY)
Admission: EM | Admit: 2017-08-28 | Discharge: 2017-08-29 | Disposition: A | Discharge status: Home / Self Care | Payer: Federal, State, Local not specified - PPO | Attending: Emergency Medicine | Admitting: Emergency Medicine

## 2017-08-28 DIAGNOSIS — Y999 Unspecified external cause status: Secondary | ICD-10-CM | POA: Diagnosis not present

## 2017-08-28 DIAGNOSIS — Y9389 Activity, other specified: Secondary | ICD-10-CM | POA: Insufficient documentation

## 2017-08-28 DIAGNOSIS — Y92410 Unspecified street and highway as the place of occurrence of the external cause: Secondary | ICD-10-CM | POA: Insufficient documentation

## 2017-08-28 DIAGNOSIS — S70212A Abrasion, left hip, initial encounter: Secondary | ICD-10-CM | POA: Insufficient documentation

## 2017-08-28 DIAGNOSIS — S40812A Abrasion of left upper arm, initial encounter: Secondary | ICD-10-CM | POA: Insufficient documentation

## 2017-08-28 DIAGNOSIS — M7918 Myalgia, other site: Secondary | ICD-10-CM

## 2017-08-28 DIAGNOSIS — M791 Myalgia, unspecified site: Secondary | ICD-10-CM | POA: Insufficient documentation

## 2017-08-28 DIAGNOSIS — T07XXXA Unspecified multiple injuries, initial encounter: Secondary | ICD-10-CM

## 2017-08-28 MED ORDER — BACITRACIN ZINC 500 UNIT/GM EX OINT
TOPICAL_OINTMENT | Freq: Once | CUTANEOUS | Status: AC
  Administered 2017-08-28: 1 via TOPICAL

## 2017-08-28 NOTE — ED Notes (Signed)
Patient transported to X-ray 
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
## 2017-08-29 NOTE — Discharge Instructions (Signed)
Continue dressing wounds with antibiotic ointment.  You may use ibuprofen or Tylenol as needed for pain.  Your x-rays today look good.  Please follow-up with your primary care doctor.  Return to the emergency department for worsening pain, redness, swelling or drainage from abrasions or any other new or concerning symptoms.

## 2017-09-18 ENCOUNTER — Telehealth (INDEPENDENT_AMBULATORY_CARE_PROVIDER_SITE_OTHER): Payer: Self-pay | Admitting: Orthopaedic Surgery

## 2017-09-18 NOTE — Telephone Encounter (Signed)
See message below.  Let me know what you want letter to say, I can do letter.

## 2017-09-18 NOTE — Telephone Encounter (Signed)
He has no restrictions.  He has full function.

## 2017-09-18 NOTE — Telephone Encounter (Signed)
Patient is requesting a letter addressing the following his range of motion, stability, strength, and pain. This is for the [employer] in order for him to be release to have normal duties. Please call patient when ready and he will pick up # [phone]

## 2017-09-19 ENCOUNTER — Encounter (INDEPENDENT_AMBULATORY_CARE_PROVIDER_SITE_OTHER): Payer: Self-pay

## 2017-09-19 NOTE — Telephone Encounter (Signed)
Called pt.

## 2017-09-19 NOTE — Telephone Encounter (Signed)
Can you please call patient and let him know letter is ready for pick up.

## 2017-09-22 ENCOUNTER — Encounter (INDEPENDENT_AMBULATORY_CARE_PROVIDER_SITE_OTHER): Payer: Self-pay

## 2017-11-11 ENCOUNTER — Ambulatory Visit (INDEPENDENT_AMBULATORY_CARE_PROVIDER_SITE_OTHER): Payer: Federal, State, Local not specified - PPO | Admitting: Orthopaedic Surgery

## 2017-11-11 ENCOUNTER — Ambulatory Visit (INDEPENDENT_AMBULATORY_CARE_PROVIDER_SITE_OTHER): Payer: Self-pay

## 2017-11-11 ENCOUNTER — Encounter (INDEPENDENT_AMBULATORY_CARE_PROVIDER_SITE_OTHER): Payer: Self-pay | Admitting: Orthopaedic Surgery

## 2017-11-11 DIAGNOSIS — S62001D Unspecified fracture of navicular [scaphoid] bone of right wrist, subsequent encounter for fracture with routine healing: Secondary | ICD-10-CM | POA: Diagnosis not present

## 2017-11-11 NOTE — Progress Notes (Signed)
      Patient: Clinton HeirDennis Odonnell           Date of Birth: 11/14/1995           MRN: [id number] Visit Date: 11/11/2017 PCP: System, Provider Not In   Assessment & Plan:  Chief Complaint:  Chief Complaint  Patient presents with  . Right Wrist - Pain   Visit Diagnoses:  1. Closed displaced fracture of scaphoid of right wrist with routine healing, unspecified portion of scaphoid, subsequent encounter     Plan: Patient is a pleasant 22 year old gentleman who presents to our clinic today 7 months status post ORIF right scaphoid fracture.  He has been doing very well since surgery.  He has regained full range of motion and strength and is pain-free.  At this time, he needs to be cleared to en[employer] in [place] [employer].  Examination of his right wrist shows no bony tenderness.  No swelling.  Full composite fist, radial deviation to 30 degrees ulnar deviation to 50 degrees, wrist extension to 80 degrees and wrist flexion to 90 degrees.  He is cleared from a range of motion, stability, pain and mobility standpoint.  He will follow-up with us as needed.  A formal note was written today for this.  Follow-Up Instructions: Return if symptoms worsen or fail to improve.   Orders:  Orders Placed This Encounter  Procedures  . XR Wrist Complete Right   No orders of the defined types were placed in this encounter.   Imaging: Xr Wrist Complete Right  Result Date: 11/11/2017 Stable compression screw within scaphoid.  No complications   PMFS History: Patient Active Problem List   Diagnosis Date Noted  . Closed displaced fracture of scaphoid of right wrist with routine healing 04/01/2017   Past Medical History:  Diagnosis Date  . Abrasions of multiple sites 03/28/2017   bilateral forearm, right hand  . Scaphoid fracture of wrist 03/28/2017   right    History reviewed. No pertinent family history.  Past Surgical History:  Procedure Laterality Date  . ORCHIOPEXY     age 89  . ORIF SCAPHOID  FRACTURE Right 04/02/2017   Procedure: OPEN REDUCTION INTERNAL FIXATION (ORIF) RIGHT SCAPHOID FRACTURE;  Surgeon: Tarry KosXu, Naiping M, MD;  Location:  SURGERY CENTER;  Service: Orthopedics;  Laterality: Right;   Social History   Occupational History  . Not on file  Tobacco Use  . Smoking status: Never Smoker  . Smokeless tobacco: Never Used  Substance and Sexual Activity  . Alcohol use: Yes    Frequency: Never    Comment: occasionally  . Drug use: No  . Sexual activity: Yes    Birth control/protection: None

## 2019-04-08 IMAGING — RF DG WRIST COMPLETE 3+V*R*
1 series · 3 of 3 positions shown · non-contrast
Comparison: None.

CLINICAL DATA: Open reduction and internal fixation of right
scaphoid fracture.

EXAM:
RIGHT WRIST - COMPLETE 3+ VIEW
FLUOROSCOPY TIME:  1 minutes 34 seconds.

[Series 1: run · 3 of 3 slices shown]
[im 1/3]
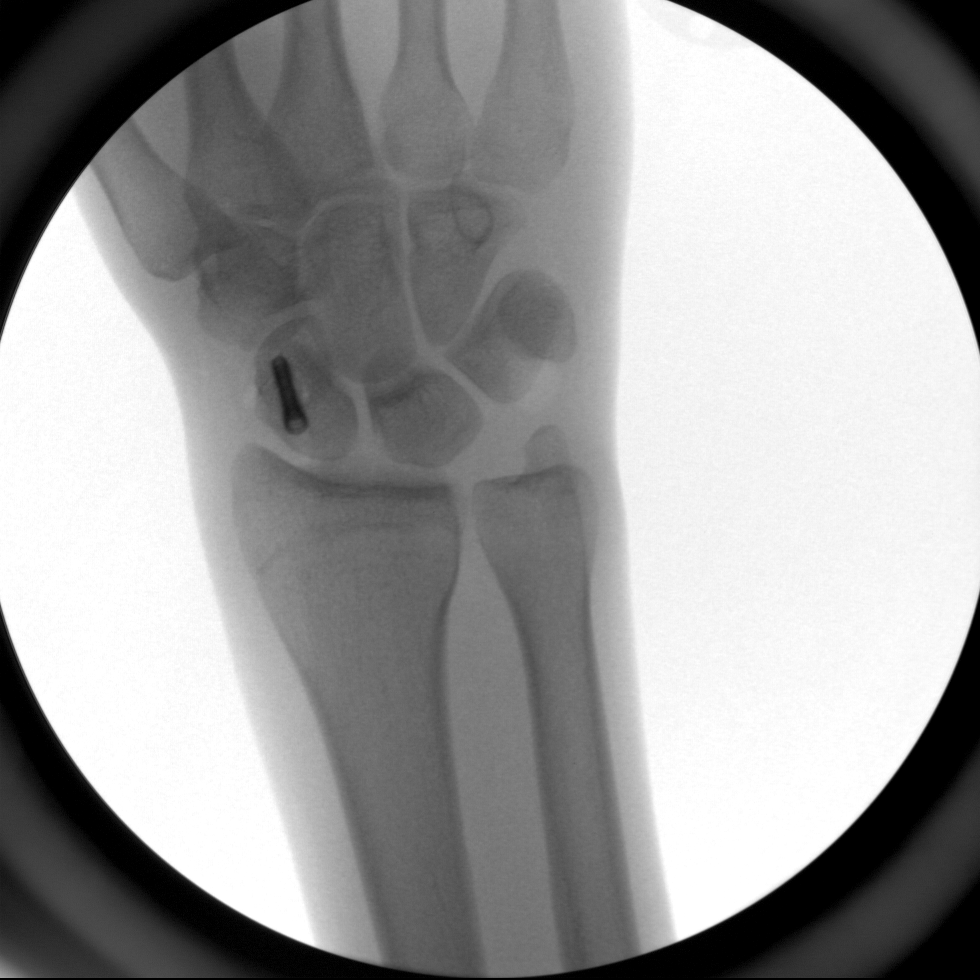
[im 2/3]
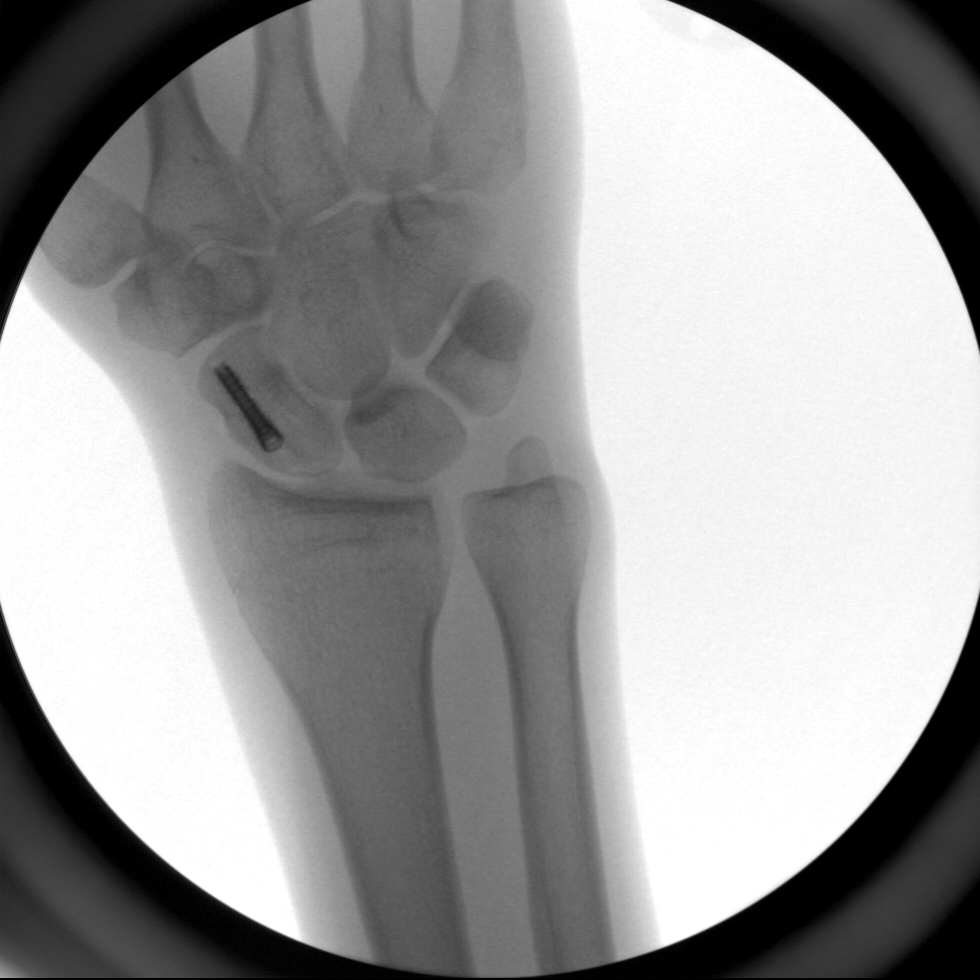
[im 3/3]
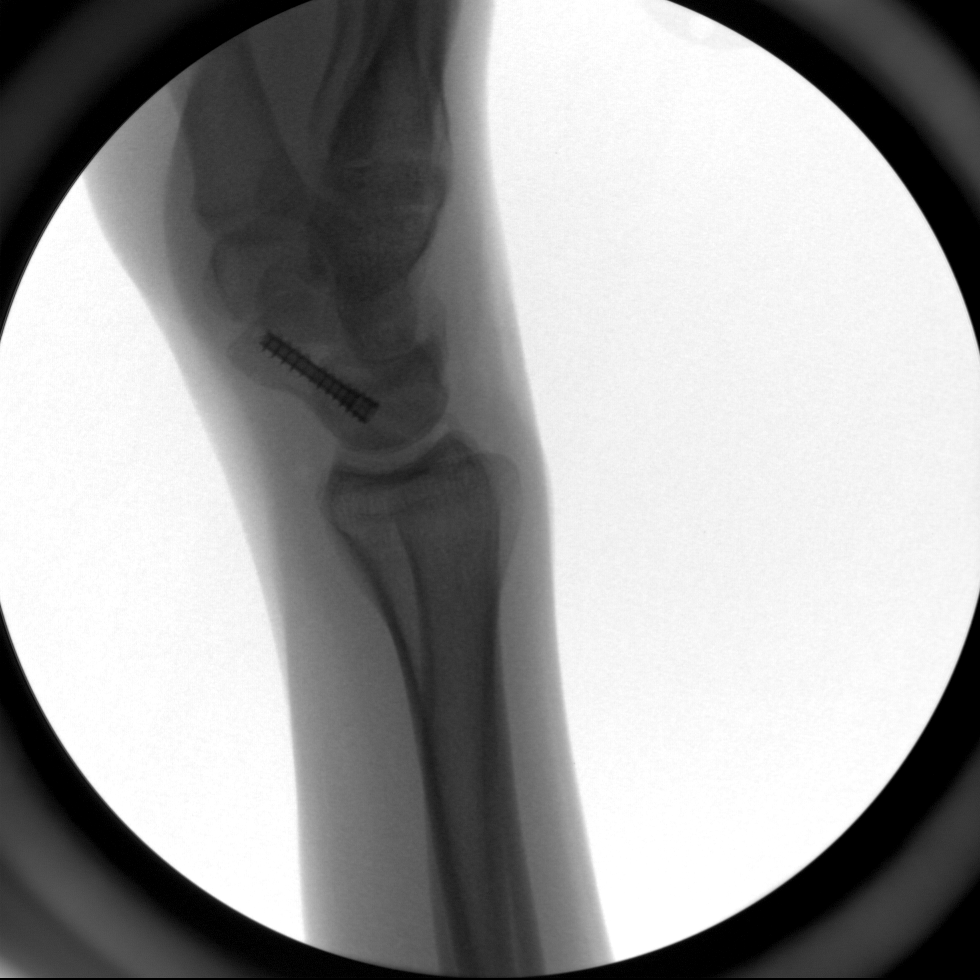

[3 of 3 positions shown; findings below may reference images not displayed]

FINDINGS: Three intraoperative fluoroscopic images of the right wrist
demonstrate screw placement within scaphoid bone. No other bony
abnormality is noted.
IMPRESSION: Status post surgical internal fixation of scaphoid fracture.

## 2019-08-28 ENCOUNTER — Ambulatory Visit (HOSPITAL_COMMUNITY)
Admission: EM | Admit: 2019-08-28 | Discharge: 2019-08-28 | Disposition: A | Discharge status: Home / Self Care | Payer: Federal, State, Local not specified - PPO | Attending: Family Medicine | Admitting: Family Medicine

## 2019-08-28 ENCOUNTER — Other Ambulatory Visit: Payer: Self-pay

## 2019-08-28 ENCOUNTER — Encounter (HOSPITAL_COMMUNITY): Payer: Self-pay

## 2019-08-28 DIAGNOSIS — J029 Acute pharyngitis, unspecified: Secondary | ICD-10-CM | POA: Insufficient documentation

## 2019-08-28 DIAGNOSIS — Z20822 Contact with and (suspected) exposure to covid-19: Secondary | ICD-10-CM | POA: Insufficient documentation

## 2019-08-28 LAB — SARS CORONAVIRUS 2 (TAT 6-24 HRS): SARS Coronavirus 2: NEGATIVE

## 2019-08-28 LAB — POCT RAPID STREP A: Streptococcus, Group A Screen (Direct): NEGATIVE

## 2019-08-28 NOTE — ED Triage Notes (Addendum)
Pt present sore throat, symptoms started on Thursday. Pt tried otc medication with no relief. Pt is having difficulty swallowing

## 2019-08-28 NOTE — Discharge Instructions (Addendum)
You may use over the counter ibuprofen or acetaminophen as needed.  For a sore throat, over the counter products such as [employer] Peroxyl Mouth Sore Rinse or Chloraseptic Sore Throat Spray may provide some temporary relief. Your rapid strep test was negative today. We have sent your throat swab for culture and will let you know of any positive results.  You have been tested for COVID-19 today. If your test returns positive, you will receive a phone call from [facility] regarding your results. Negative test results are not called. Both positive and negative results area always visible on MyChart. If you do not have a MyChart account, sign up instructions are provided in your discharge papers. Please do not hesitate to contact us should you have questions or concerns.  

## 2019-08-28 NOTE — ED Provider Notes (Signed)
[place]   [id number] 08/28/19 Arrival Time: 2979  ASSESSMENT & PLAN:  1. Sore throat     No signs of peritonsillar abscess.  COVID testing sent. See chart for work/school letter.   Results for orders placed or performed during the hospital encounter of 08/28/19  POCT rapid strep A [facility])  Result Value Ref Range   Streptococcus, Group A Screen (Direct) NEGATIVE NEGATIVE   Labs Reviewed  SARS CORONAVIRUS 2 (TAT 6-24 HRS)  CULTURE, GROUP A STREP [facility])  POCT RAPID STREP A    OTC analgesics and throat care as needed    Discharge Instructions      You may use over the counter ibuprofen or acetaminophen as needed.   For a sore throat, over the counter products such as [employer] Peroxyl Mouth Sore Rinse or Chloraseptic Sore Throat Spray may provide some temporary relief.  Your rapid strep test was negative today. We have sent your throat swab for culture and will let you know of any positive results.  You have been tested for COVID-19 today. If your test returns positive, you will receive a phone call from [facility] regarding your results. Negative test results are not called. Both positive and negative results area always visible on MyChart. If you do not have a MyChart account, sign up instructions are provided in your discharge papers. Please do not hesitate to contact us should you have questions or concerns.    Reviewed expectations re: course of current medical issues. Questions answered. Outlined signs and symptoms indicating need for more acute intervention. Patient verbalized understanding. After Visit Summary given.   SUBJECTIVE:  Clinton Odonnell is a 24 y.o. male who reports a sore throat. Describes as pain with swallowing. Onset gradual beginning a few days ago. Symptoms have progressed to a point and plateaued since beginning; without voice changes. No respiratory symptoms. Normal PO intake but reports discomfort with swallowing.  No specific alleviating factors. Fever reported: absent. No neck pain or swelling. No associated nausea, vomiting, or abdominal pain. Known sick contacts: none. Recent travel: none. OTC treatment: various without much relief    OBJECTIVE:  Vitals:   08/28/19 1129  BP: 113/62  Pulse: (!) 103  Resp: 18  Temp: 99.2 F (37.3 C)  TempSrc: Oral  SpO2: 99%     General appearance: alert; no distress HEENT: throat with mild erythema and cobblestoning; uvula is midline Neck: supple with FROM; no lymphadenopathy CV: slight tachycardia noted Lungs: clear to auscultation bilaterally Abd: soft; non-tender Skin: reveals no rash; warm and dry Psychological: alert and cooperative; normal mood and affect  No Known Allergies  Past Medical History:  Diagnosis Date  . Abrasions of multiple sites 03/28/2017   bilateral forearm, right hand  . Scaphoid fracture of wrist 03/28/2017   right   Social History   Socioeconomic History  . Marital status: Single    Spouse name: Not on file  . Number of children: Not on file  . Years of education: Not on file  . Highest education level: Not on file  Occupational History  . Not on file  Tobacco Use  . Smoking status: Never Smoker  . Smokeless tobacco: Never Used  Substance and Sexual Activity  . Alcohol use: Yes    Comment: occasionally  . Drug use: No  . Sexual activity: Yes    Birth control/protection: None  Other Topics Concern  . Not on file  Social History Narrative  . Not on file  Social Determinants of Health   Financial Resource Strain:   . Difficulty of Paying Living Expenses:   Food Insecurity:   . Worried About Programme researcher, broadcasting/film/video in the Last Year:   . Barista in the Last Year:   Transportation Needs:   . Freight forwarder (Medical):   Marland Kitchen Lack of Transportation (Non-Medical):   Physical Activity:   . Days of Exercise per Week:   . Minutes of Exercise per Session:   Stress:   . Feeling of Stress :    Social Connections:   . Frequency of Communication with Friends and Family:   . Frequency of Social Gatherings with Friends and Family:   . Attends Religious Services:   . Active Member of Clubs or Organizations:   . Attends Banker Meetings:   Marland Kitchen Marital Status:   Intimate Partner Violence:   . Fear of Current or Ex-Partner:   . Emotionally Abused:   Marland Kitchen Physically Abused:   . Sexually Abused:    History reviewed. No pertinent family history.        Mardella Layman, MD 08/30/19 [phone]

## 2019-08-30 LAB — CULTURE, GROUP A STREP (THRC)

## 2019-11-30 ENCOUNTER — Emergency Department (HOSPITAL_COMMUNITY): Payer: Federal, State, Local not specified - PPO

## 2019-11-30 ENCOUNTER — Encounter (HOSPITAL_COMMUNITY): Payer: Self-pay

## 2019-11-30 ENCOUNTER — Other Ambulatory Visit: Payer: Self-pay

## 2019-11-30 DIAGNOSIS — J069 Acute upper respiratory infection, unspecified: Secondary | ICD-10-CM | POA: Insufficient documentation

## 2019-11-30 DIAGNOSIS — R161 Splenomegaly, not elsewhere classified: Secondary | ICD-10-CM | POA: Diagnosis not present

## 2019-11-30 DIAGNOSIS — Z20822 Contact with and (suspected) exposure to covid-19: Secondary | ICD-10-CM | POA: Insufficient documentation

## 2019-11-30 DIAGNOSIS — K5120 Ulcerative (chronic) proctitis without complications: Secondary | ICD-10-CM | POA: Diagnosis not present

## 2019-11-30 DIAGNOSIS — R05 Cough: Secondary | ICD-10-CM | POA: Diagnosis present

## 2019-11-30 MED ORDER — SODIUM CHLORIDE 0.9% FLUSH
3.0000 mL | Freq: Once | INTRAVENOUS | Status: AC
  Administered 2019-12-01: 04:00:00 3 mL via INTRAVENOUS

## 2019-11-30 NOTE — ED Notes (Signed)
Pt provided w/labeled specimen cup for urine collection. ENMiles 

## 2019-11-30 NOTE — ED Triage Notes (Signed)
Patient arrived stating he has been sick for about a week with chills, headache, and cough. Reports right sided abdominal pain and feeling difficulty passing gas. Reports taking musinex.

## 2019-12-01 ENCOUNTER — Emergency Department (HOSPITAL_COMMUNITY)
Admission: EM | Admit: 2019-12-01 | Discharge: 2019-12-01 | Disposition: A | Discharge status: Home / Self Care | Payer: Federal, State, Local not specified - PPO | Attending: Emergency Medicine | Admitting: Emergency Medicine

## 2019-12-01 ENCOUNTER — Encounter (HOSPITAL_COMMUNITY): Payer: Self-pay

## 2019-12-01 ENCOUNTER — Emergency Department (HOSPITAL_COMMUNITY): Payer: Federal, State, Local not specified - PPO

## 2019-12-01 DIAGNOSIS — J988 Other specified respiratory disorders: Secondary | ICD-10-CM

## 2019-12-01 DIAGNOSIS — R059 Cough, unspecified: Secondary | ICD-10-CM

## 2019-12-01 DIAGNOSIS — K6289 Other specified diseases of anus and rectum: Secondary | ICD-10-CM

## 2019-12-01 DIAGNOSIS — R161 Splenomegaly, not elsewhere classified: Secondary | ICD-10-CM

## 2019-12-01 LAB — COMPREHENSIVE METABOLIC PANEL
ALT: 29 U/L (ref 0–44)
AST: 26 U/L (ref 15–41)
Albumin: 3.9 g/dL (ref 3.5–5.0)
Alkaline Phosphatase: 83 U/L (ref 38–126)
Anion gap: 9 (ref 5–15)
BUN: 9 mg/dL (ref 6–20)
CO2: 31 mmol/L (ref 22–32)
Calcium: 9.2 mg/dL (ref 8.9–10.3)
Chloride: 98 mmol/L (ref 98–111)
Creatinine, Ser: 0.9 mg/dL (ref 0.61–1.24)
GFR calc Af Amer: >60 mL/min (ref >60)
GFR calc non Af Amer: >60 mL/min (ref >60)
Glucose, Bld: 109 mg/dL — ABNORMAL HIGH (ref 70–99)
Potassium: 4.6 mmol/L (ref 3.5–5.1)
Sodium: 138 mmol/L (ref 135–145)
Total Bilirubin: 1 mg/dL (ref 0.3–1.2)
Total Protein: 7.4 g/dL (ref 6.5–8.1)

## 2019-12-01 LAB — URINALYSIS, ROUTINE W REFLEX MICROSCOPIC
Bilirubin Urine: NEGATIVE
Glucose, UA: NEGATIVE mg/dL
Hgb urine dipstick: NEGATIVE
Ketones, ur: NEGATIVE mg/dL
Leukocytes,Ua: NEGATIVE
Nitrite: NEGATIVE
Protein, ur: NEGATIVE mg/dL
Specific Gravity, Urine: 1.016 (ref 1.005–1.030)
pH: 7 (ref 5.0–8.0)

## 2019-12-01 LAB — CBC
HCT: 47.5 % (ref 39.0–52.0)
Hemoglobin: 16.4 g/dL (ref 13.0–17.0)
MCH: 30.2 pg (ref 26.0–34.0)
MCHC: 34.5 g/dL (ref 30.0–36.0)
MCV: 87.5 fL (ref 80.0–100.0)
Platelets: 294 10*3/uL (ref 150–400)
RBC: 5.43 MIL/uL (ref 4.22–5.81)
RDW: 13.1 % (ref 11.5–15.5)
WBC: 12.5 10*3/uL — ABNORMAL HIGH (ref 4.0–10.5)
nRBC: 0 % (ref 0.0–0.2)

## 2019-12-01 LAB — MONONUCLEOSIS SCREEN: Mono Screen: NEGATIVE

## 2019-12-01 LAB — SARS CORONAVIRUS 2 BY RT PCR (HOSPITAL ORDER, PERFORMED IN ~~LOC~~ HOSPITAL LAB): SARS Coronavirus 2: NEGATIVE

## 2019-12-01 LAB — HIV ANTIBODY (ROUTINE TESTING W REFLEX): HIV Screen 4th Generation wRfx: NONREACTIVE

## 2019-12-01 LAB — LIPASE, BLOOD: Lipase: 20 U/L (ref 11–51)

## 2019-12-01 MED ORDER — SODIUM CHLORIDE 0.9 % IV BOLUS
1000.0000 mL | Freq: Once | INTRAVENOUS | Status: AC
  Administered 2019-12-01: 04:00:00 1000 mL via INTRAVENOUS

## 2019-12-01 MED ORDER — CEFTRIAXONE SODIUM 1 G IJ SOLR
500.0000 mg | Freq: Once | INTRAMUSCULAR | Status: AC
  Administered 2019-12-01: 500 mg via INTRAMUSCULAR
  Filled 2019-12-01: qty 10

## 2019-12-01 MED ORDER — IOHEXOL 300 MG/ML  SOLN
100.0000 mL | Freq: Once | INTRAMUSCULAR | Status: AC | PRN
  Administered 2019-12-01: 05:00:00 100 mL via INTRAVENOUS

## 2019-12-01 MED ORDER — DOXYCYCLINE HYCLATE 100 MG PO TABS
100.0000 mg | ORAL_TABLET | Freq: Once | ORAL | Status: AC
  Administered 2019-12-01: 100 mg via ORAL
  Filled 2019-12-01: qty 1

## 2019-12-01 MED ORDER — STERILE WATER FOR INJECTION IJ SOLN
INTRAMUSCULAR | Status: AC
  Filled 2019-12-01: qty 10

## 2019-12-01 MED ORDER — DOXYCYCLINE HYCLATE 100 MG PO CAPS: 100.0000 mg | ORAL_CAPSULE | Freq: Two times a day (BID) | ORAL | 0 refills | Status: AC

## 2019-12-01 MED ORDER — SODIUM CHLORIDE (PF) 0.9 % IJ SOLN
INTRAMUSCULAR | Status: AC
  Filled 2019-12-01: qty 50

## 2019-12-01 NOTE — Discharge Instructions (Addendum)
Follow up with student health or see your PCP for ongoing care. Your spleen today is enlarged. This can be from a viral infection. Recommend no contact sports/activities until cleared. Your CT scan today shows possible infection around your prostate/colon. You have been prescribed Doxycycline for this, take the full course.  Additional tests sent out today include HIV and gonorrhea/chlamydia. These results will be available in a few days in your mychart.

## 2019-12-01 NOTE — ED Provider Notes (Signed)
[facility]-EMERGENCY DEPT Provider Note   CSN: [id number] Arrival date & time: 11/30/19  2227     History No chief complaint on file.   Clinton HeirDennis Odonnell is a 24 y.o. male.  24 year old male presents with complaint of nonproductive cough, fever (max temp 100), chills, body aches.  Patient states symptoms started 1 week ago, states that [place] [employer], returned 1 week ago, not feeling well.  No known sick contacts.  Patient reports right lower quadrant abdominal pain onset yesterday, associated with nausea.  Denies changes in bowel or bladder habits, blood in stools.  Reports history of prior right side undescended testicle corrected surgically.  Denies testicular pain today.  No other complaints or concerns.         Past Medical History:  Diagnosis Date  . Abrasions of multiple sites 03/28/2017   bilateral forearm, right hand  . Scaphoid fracture of wrist 03/28/2017   right    Patient Active Problem List   Diagnosis Date Noted  . Closed displaced fracture of scaphoid of right wrist with routine healing 04/01/2017    Past Surgical History:  Procedure Laterality Date  . ORCHIOPEXY     age 89  . ORIF SCAPHOID FRACTURE Right 04/02/2017   Procedure: OPEN REDUCTION INTERNAL FIXATION (ORIF) RIGHT SCAPHOID FRACTURE;  Surgeon: Tarry KosXu, Naiping M, MD;  Location: [facility];  Service: Orthopedics;  Laterality: Right;       No family history on file.  Social History   Tobacco Use  . Smoking status: Never Smoker  . Smokeless tobacco: Never Used  Vaping Use  . Vaping Use: Never used  Substance Use Topics  . Alcohol use: Yes    Comment: occasionally  . Drug use: No    Home Medications Prior to Admission medications   Medication Sig Start Date End Date Taking? Authorizing Provider  doxycycline (VIBRAMYCIN) 100 MG capsule Take 1 capsule (100 mg total) by mouth 2 (two) times daily for 14 days. 12/01/19 12/15/19   Jeannie FendMurphy, Michille Mcelrath A, PA-C  oxyCODONE-acetaminophen (PERCOCET) 5-325 MG tablet Take 1-2 tablets every 4 (four) hours as needed by mouth for severe pain. Patient not taking: Reported on 04/15/2017 04/02/17   Tarry KosXu, Naiping M, MD  promethazine (PHENERGAN) 25 MG tablet Take 1 tablet (25 mg total) every 6 (six) hours as needed by mouth for nausea. Patient not taking: Reported on 04/15/2017 04/02/17   Tarry KosXu, Naiping M, MD    Allergies    Patient has no known allergies.  Review of Systems   Review of Systems  Constitutional: Positive for chills and fever.  Respiratory: Positive for cough. Negative for shortness of breath.   Gastrointestinal: Positive for abdominal pain. Negative for nausea and vomiting.  Genitourinary: Negative for difficulty urinating, dysuria, scrotal swelling and testicular pain.  Musculoskeletal: Positive for myalgias.  Skin: Negative for rash and wound.  Allergic/Immunologic: Negative for immunocompromised state.  Neurological: Negative for weakness.  Hematological: Negative for adenopathy.  All other systems reviewed and are negative.   Physical Exam Updated Vital Signs BP 107/85   Pulse 84   Temp 98.8 F (37.1 C)   Resp 17   Ht 6' (1.829 m)   Wt 61.2 kg   SpO2 100%   BMI 18.31 kg/m   Physical Exam Vitals and nursing note reviewed.  Constitutional:      General: He is not in acute distress.    Appearance: He is well-developed. He is not diaphoretic.  HENT:     Head: Normocephalic and atraumatic.  Cardiovascular:     Rate and Rhythm: Normal rate and regular rhythm.     Pulses: Normal pulses.     Heart sounds: Normal heart sounds.  Pulmonary:     Effort: Pulmonary effort is normal.     Breath sounds: Normal breath sounds.  Abdominal:     Palpations: Abdomen is soft.     Tenderness: There is abdominal tenderness in the right lower quadrant. There is no right CVA tenderness, left CVA tenderness, guarding or rebound.     Hernia: No hernia is present.    Musculoskeletal:     Cervical back: Neck supple.     Right lower leg: No edema.     Left lower leg: No edema.  Skin:    General: Skin is warm and dry.     Findings: No erythema or rash.  Neurological:     Mental Status: He is alert and oriented to person, place, and time.  Psychiatric:        Behavior: Behavior normal.     ED Results / Procedures / Treatments   Labs (all labs ordered are listed, but only abnormal results are displayed) Labs Reviewed  COMPREHENSIVE METABOLIC PANEL - Abnormal; Notable for the following components:      Result Value   Glucose, Bld 109 (*)    All other components within normal limits  CBC - Abnormal; Notable for the following components:   WBC 12.5 (*)    All other components within normal limits  SARS CORONAVIRUS 2 BY RT PCR (HOSPITAL ORDER, PERFORMED IN [facility])  LIPASE, BLOOD  URINALYSIS, ROUTINE W REFLEX MICROSCOPIC  HIV ANTIBODY (ROUTINE TESTING W REFLEX)  MONONUCLEOSIS SCREEN  GC/CHLAMYDIA PROBE AMP ([place]) NOT AT [facility]    EKG None  Radiology DG Chest 2 View  Result Date: 12/01/2019 CLINICAL DATA:  Productive cough and fever EXAM: CHEST - 2 VIEW COMPARISON:  None. FINDINGS: The heart size and mediastinal contours are within normal limits. Both lungs are clear. The visualized skeletal structures are unremarkable. IMPRESSION: No active cardiopulmonary disease. Electronically Signed   By: Sharlet Salina M.D.   On: 12/01/2019 00:52   CT Abdomen Pelvis W Contrast  Result Date: 12/01/2019 CLINICAL DATA:  Right lower quadrant abdominal pain for 1 week. EXAM: CT ABDOMEN AND PELVIS WITH CONTRAST TECHNIQUE: Multidetector CT imaging of the abdomen and pelvis was performed using the standard protocol following bolus administration of intravenous contrast. CONTRAST:  OMNIPAQUE IOHEXOL 300 MG/ML  SOLN COMPARISON:  None. FINDINGS: Lower chest: The lung bases are clear of acute process. No pleural effusion or pulmonary  lesions. The heart is normal in size. No pericardial effusion. The distal esophagus and aorta are unremarkable. Hepatobiliary: No focal hepatic lesions or intrahepatic biliary dilatation. The gallbladder is normal. No common bile duct dilatation. Pancreas: No mass, inflammation or ductal dilatation. Spleen: Borderline splenic enlargement. Spleen measures 14.0 x 12.0 x 9.3 cm. No splenic lesions. Adrenals/Urinary Tract: The adrenal glands and kidneys are unremarkable. No renal lesions, renal calculi or findings for pyelonephritis. Stomach/Bowel: The stomach, duodenum, small bowel and colon are grossly normal without oral contrast. No acute inflammatory changes, mass lesions or obstructive findings. Suspect rectal wall thickening and perirectal inflammatory process. The prostate gland is also slightly indistinct in the seminal vesicles are prominent. There are slightly enlarged perirectal and lower pelvic lymph nodes. The appendix is normal. Vascular/Lymphatic: The aorta is normal. The branch vessels are  patent. The major venous structures are patent. Reproductive: Prostate gland is normal in size but the periprosthetic fat planes are not well defined. Seminal vesicles appear prominent. Other: No free pelvic fluid collections or pelvic abscess. No perirectal abscess or peroneal abscess. No inguinal adenopathy. Musculoskeletal: The bony structures are unremarkable. IMPRESSION: 1. CT findings suggest some type of pelvic inflammatory process. Findings suspicious for proctitis with rectal and perirectal inflammation and prominent perirectal and sigmoid mesocolon lymph nodes. No evidence for pelvic abscess. Associated inflammation around the prostate gland and seminal vesicles but I do not think that is the primary source. 2. Scattered borderline retroperitoneal lymph nodes likely reactive/hyperplastic. 3. Borderline splenomegaly. 4. No other significant findings. Electronically Signed   By: Rudie Meyer M.D.   On:  12/01/2019 05:38    Procedures Procedures (including critical care time)  Medications Ordered in ED Medications  sodium chloride (PF) 0.9 % injection (has no administration in time range)  sterile water (preservative free) injection (has no administration in time range)  sodium chloride flush (NS) 0.9 % injection 3 mL (3 mLs Intravenous Given 12/01/19 0407)  sodium chloride 0.9 % bolus 1,000 mL (0 mLs Intravenous Stopped 12/01/19 0515)  iohexol (OMNIPAQUE) 300 MG/ML solution 100 mL (100 mLs Intravenous Contrast Given 12/01/19 0508)  doxycycline (VIBRA-TABS) tablet 100 mg (100 mg Oral Given 12/01/19 2330)  cefTRIAXone (ROCEPHIN) injection 500 mg (500 mg Intramuscular Given 12/01/19 0631)    ED Course  I have reviewed the triage vital signs and the nursing notes.  Pertinent labs & imaging results that were available during my care of the patient were reviewed by me and considered in my medical decision making (see chart for details).  Clinical Course as of Dec 01 635  Wed Dec 01, 2019  0620 23yo male with complaint of body aches, cough and fever, now with right lower quadrant abdominal pain. On exam, has mild tenderness in the right lower quadrant, no cva tenderness. Labs with mild leukocytosis with WBC 12.5. CMP WNL, UA normal, COVID negative. CXR unremarkable. CT with splenomegaly, findings suggestive of pelvic inflammatory process, suspicious for proctitis with rectal and perirectal inflammation (not thought to be primary source). Borderline retroperitoneal lymph nodes.  On further history, denies recent/new sexual partners, reports prior male and male partners. Denise urethral discharge or dysuria.  Case discussed with Dr. Wilkie Aye, ER attending. Plan is to add HIV, Gc/chlamydia, mono to work up. Will cover bacterial sources with rocephin IM and Doxycycline per CDC guidelines.  Recommend no contact sports due to splenomegaly. 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 Recommend  follow up with student health if unable to see PCP, return to ER as needed.   [LM]  0762 Clinton Odonnell was evaluated in Emergency Department on 12/01/2019 for the symptoms described in the history of present illness. He was evaluated in the context of the global COVID-19 pandemic, which necessitated consideration that the patient might be at risk for infection with the SARS-CoV-2 virus that causes COVID-19. Institutional protocols and algorithms that pertain to the evaluation of patients at risk for COVID-19 are in a state of rapid change based on information released by regulatory bodies including the CDC and federal and state organizations. These policies and algorithms were followed during the patient's care in the ED.     [LM]    Clinical Course User Index [LM] Alden Hipp   MDM Rules/Calculators/A&P  Final Clinical Impression(s) / ED Diagnoses Final diagnoses:  Viral respiratory illness  Splenomegaly  Proctitis    Rx / DC Orders ED Discharge Orders         Ordered    doxycycline (VIBRAMYCIN) 100 MG capsule  2 times daily     Discontinue  Reprint     12/01/19 0615           Jeannie Fend, PA-C 12/01/19 2409    Shon Baton, MD 12/01/19 [phone]

## 2019-12-02 LAB — GC/CHLAMYDIA PROBE AMP (~~LOC~~) NOT AT ARMC
Chlamydia: NEGATIVE
Comment: NEGATIVE
Comment: NORMAL
Neisseria Gonorrhea: NEGATIVE

## 2021-12-06 IMAGING — CT CT ABD-PELV W/ CM
2 of 4 series · 15 of 46 positions shown, 17 images · IV contrast (omnipaque)
Comparison: None.

CLINICAL DATA: Right lower quadrant abdominal pain for 1 week.

EXAM:
CT ABDOMEN AND PELVIS WITH CONTRAST
TECHNIQUE: Multidetector CT imaging of the abdomen and pelvis was performed
using the standard protocol following bolus administration of
intravenous contrast.
CONTRAST:  100mL OMNIPAQUE IOHEXOL 300 MG/ML  SOLN

[Series 2: axial st · axial · 0.72mm/px · z∈[-447,-47]mm · 12 of 90 slices shown, 14 images]
[im 5/90  soft-tissue]
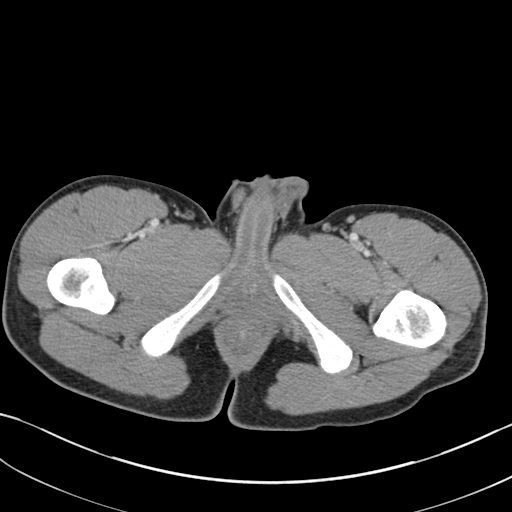
[im 5/90  bone]
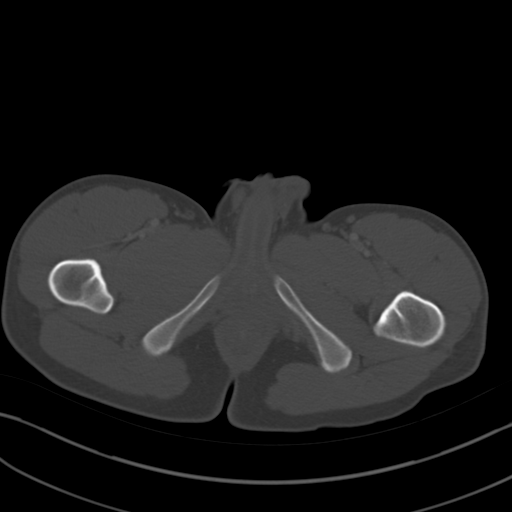
[im 13/90  soft-tissue]
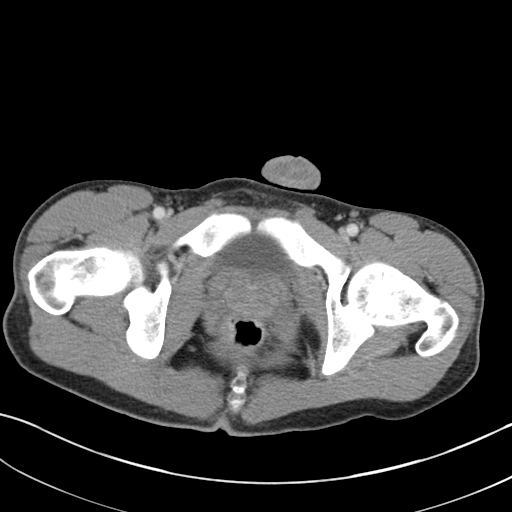
[im 21/90  soft-tissue]
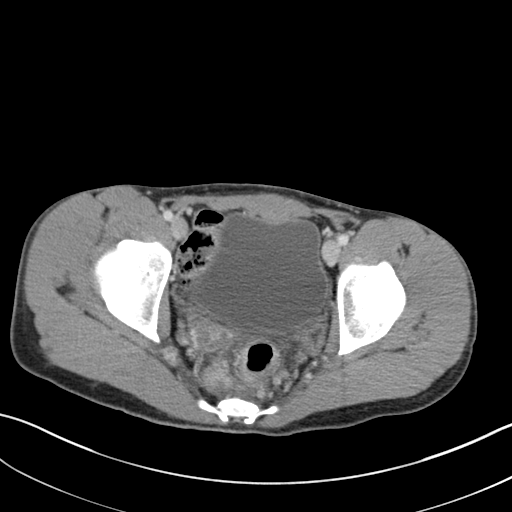
[im 29/90  soft-tissue]
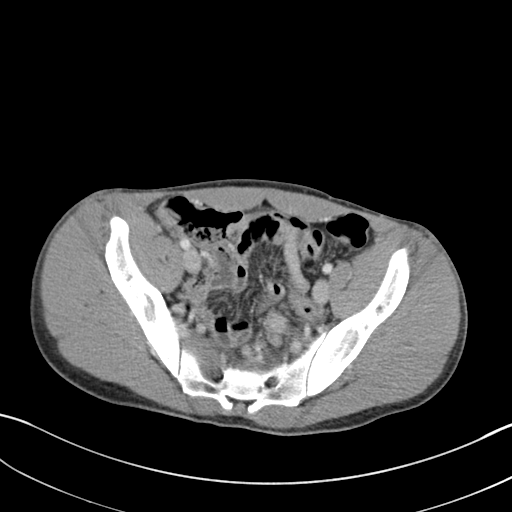
[im 33/90  soft-tissue]
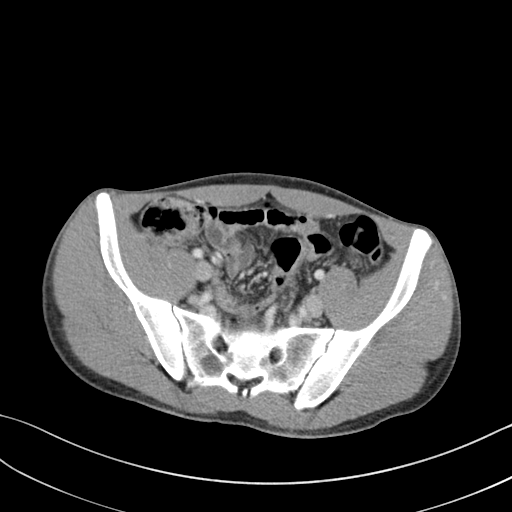
[im 41/90  soft-tissue]
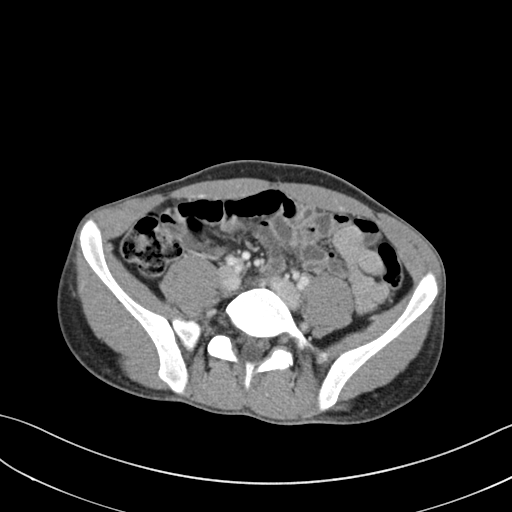
[im 49/90  soft-tissue]
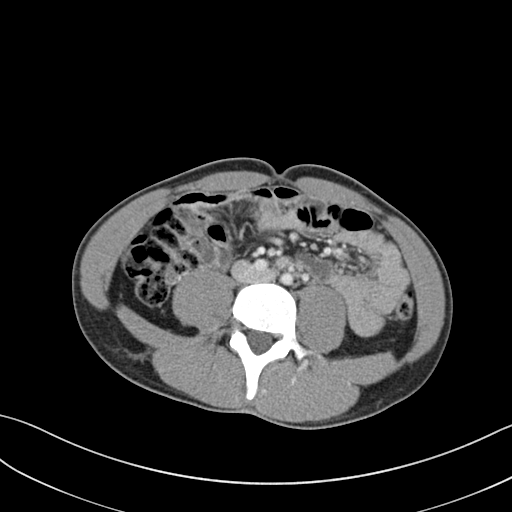
[im 57/90  soft-tissue]
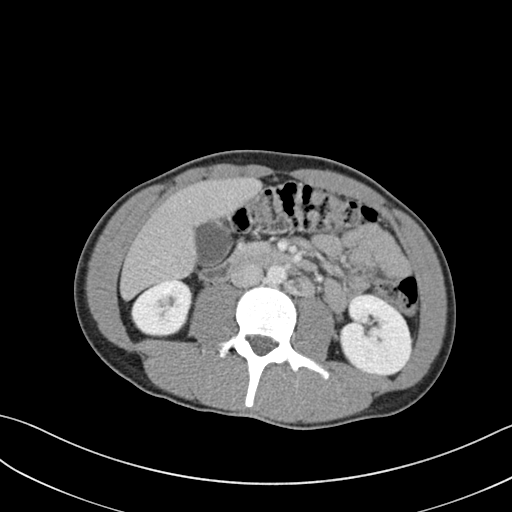
[im 61/90  soft-tissue]
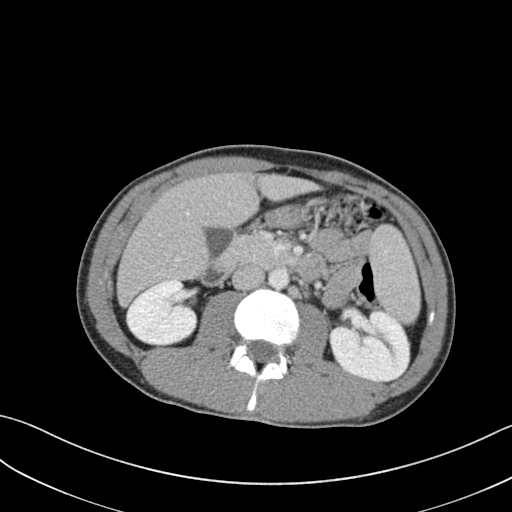
[im 61/90  bone]
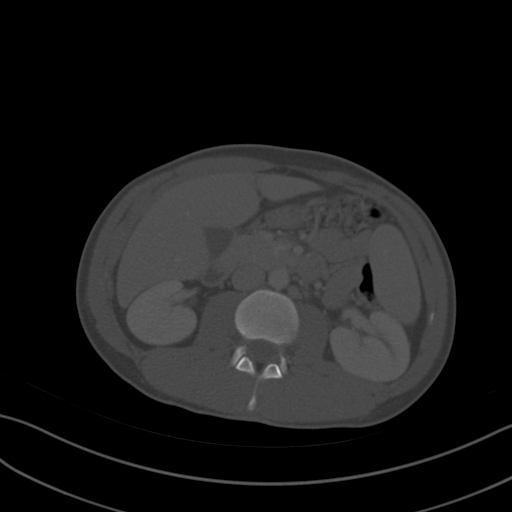
[im 69/90  soft-tissue]
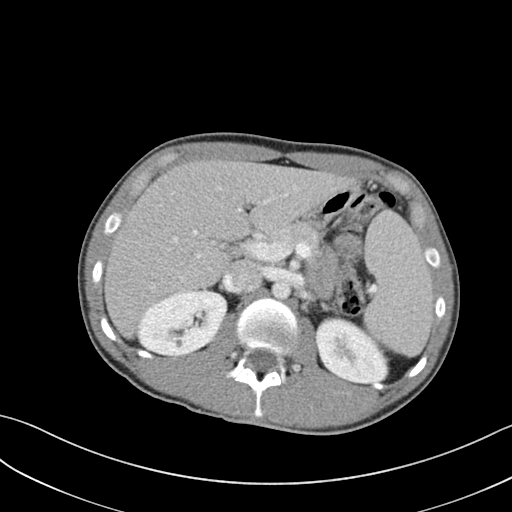
[im 77/90  soft-tissue]
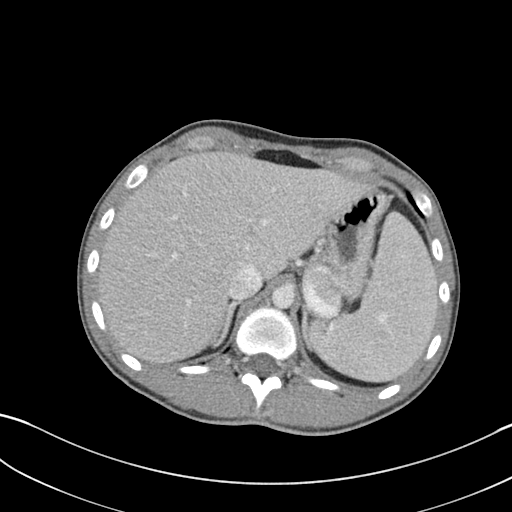
[im 85/90  soft-tissue]
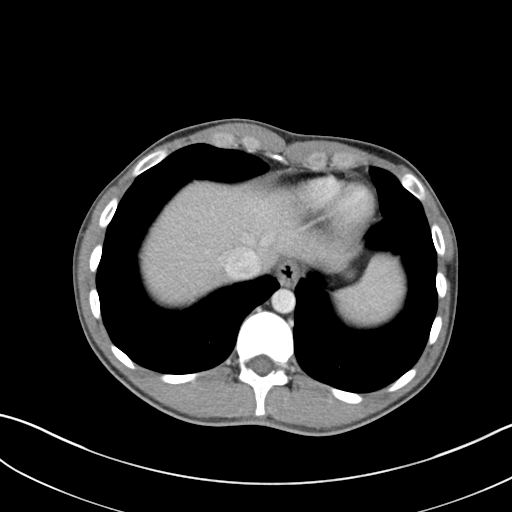

[Series 4: coronal st · coronal · 0.64mm/px · 3 of 110 slices shown]
[im 37/110  soft-tissue]
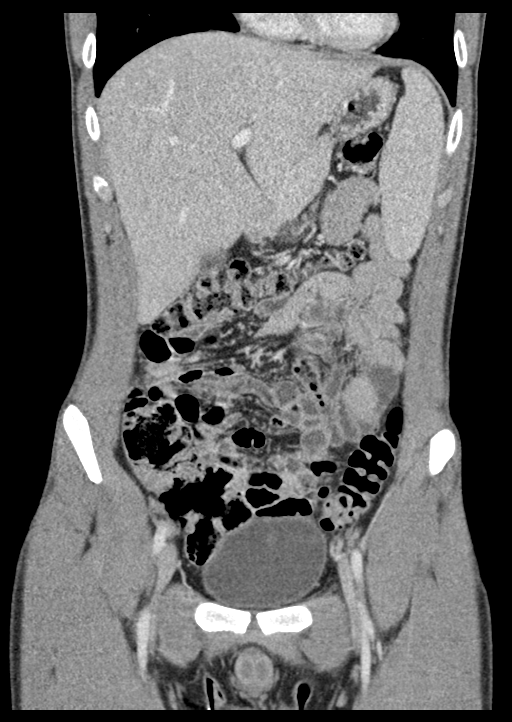
[im 49/110  soft-tissue]
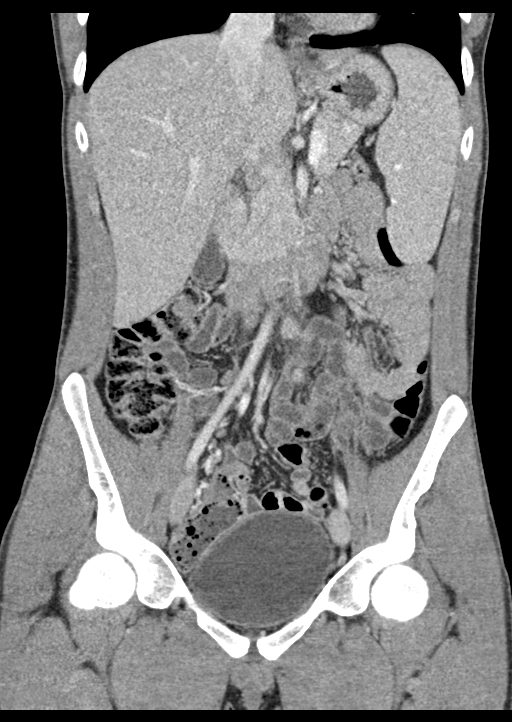
[im 61/110  soft-tissue]
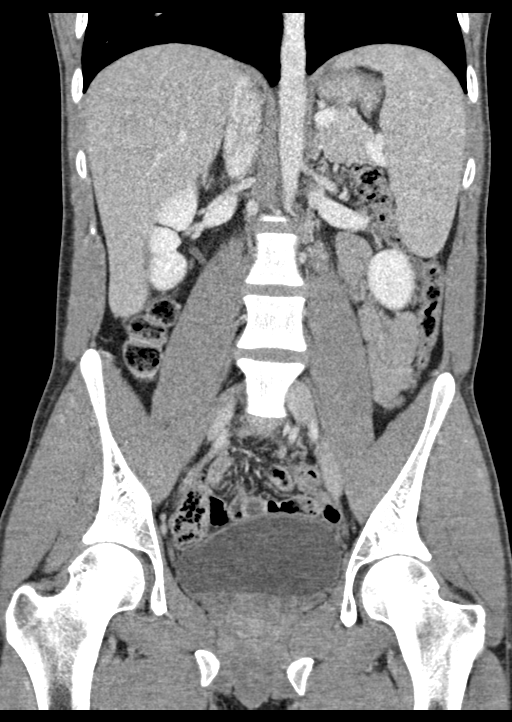

[15 of 46 positions shown; findings below may reference images not displayed]

FINDINGS: Lower chest: The lung bases are clear of acute process. No pleural
effusion or pulmonary lesions. The heart is normal in size. No
pericardial effusion. The distal esophagus and aorta are
unremarkable.

Hepatobiliary: No focal hepatic lesions or intrahepatic biliary
dilatation. The gallbladder is normal. No common bile duct
dilatation.

Pancreas: No mass, inflammation or ductal dilatation.

Spleen: Borderline splenic enlargement. Spleen measures 14.0 x
x 9.3 cm. No splenic lesions.

Adrenals/Urinary Tract: The adrenal glands and kidneys are
unremarkable. No renal lesions, renal calculi or findings for
pyelonephritis.

Stomach/Bowel: The stomach, duodenum, small bowel and colon are
grossly normal without oral contrast. No acute inflammatory changes,
mass lesions or obstructive findings.

Suspect rectal wall thickening and perirectal inflammatory process.
The prostate gland is also slightly indistinct in the seminal
vesicles are prominent. There are slightly enlarged perirectal and
lower pelvic lymph nodes.

The appendix is normal.

Vascular/Lymphatic: The aorta is normal. The branch vessels are
patent. The major venous structures are patent.

Reproductive: Prostate gland is normal in size but the
periprosthetic fat planes are not well defined. Seminal vesicles
appear prominent.

Other: No free pelvic fluid collections or pelvic abscess. No
perirectal abscess or peroneal abscess. No inguinal adenopathy.

Musculoskeletal: The bony structures are unremarkable.
IMPRESSION: 1. CT findings suggest some type of pelvic inflammatory process.
Findings suspicious for proctitis with rectal and perirectal
inflammation and prominent perirectal and sigmoid mesocolon lymph
nodes. No evidence for pelvic abscess. Associated inflammation
around the prostate gland and seminal vesicles but I do not think
that is the primary source.
2. Scattered borderline retroperitoneal lymph nodes likely
reactive/hyperplastic.
3. Borderline splenomegaly.
4. No other significant findings.
